# Patient Record
Sex: Female | Born: 1935
Health system: Southern US, Community
[De-identification: ages and names within clinical notes are randomized; demographics above are authoritative.]

## PROBLEM LIST (undated history)

## (undated) DIAGNOSIS — F329 Major depressive disorder, single episode, unspecified: Secondary | ICD-10-CM

## (undated) DIAGNOSIS — R413 Other amnesia: Secondary | ICD-10-CM

## (undated) DIAGNOSIS — F0390 Unspecified dementia without behavioral disturbance: Secondary | ICD-10-CM

## (undated) DIAGNOSIS — E785 Hyperlipidemia, unspecified: Secondary | ICD-10-CM

## (undated) DIAGNOSIS — K219 Gastro-esophageal reflux disease without esophagitis: Secondary | ICD-10-CM

## (undated) DIAGNOSIS — E079 Disorder of thyroid, unspecified: Secondary | ICD-10-CM

## (undated) DIAGNOSIS — E559 Vitamin D deficiency, unspecified: Secondary | ICD-10-CM

## (undated) DIAGNOSIS — F419 Anxiety disorder, unspecified: Secondary | ICD-10-CM

## (undated) DIAGNOSIS — N183 Chronic kidney disease, stage 3 unspecified: Secondary | ICD-10-CM

## (undated) HISTORY — DX: Chronic kidney disease, stage 3 unspecified: N18.30

## (undated) HISTORY — PX: WRIST SURGERY: SHX841

## (undated) HISTORY — PX: GLAUCOMA SURGERY: SHX656

## (undated) HISTORY — DX: Major depressive disorder, single episode, unspecified: F32.9

## (undated) HISTORY — DX: Other amnesia: R41.3

## (undated) HISTORY — DX: Anxiety disorder, unspecified: F41.9

## (undated) HISTORY — DX: Gastro-esophageal reflux disease without esophagitis: K21.9

## (undated) HISTORY — DX: Hyperlipidemia, unspecified: E78.5

## (undated) HISTORY — PX: BREAST BIOPSY: SHX20

## (undated) HISTORY — PX: CATARACT EXTRACTION, BILATERAL: SHX1313

## (undated) HISTORY — DX: Chronic kidney disease, stage 3 (moderate): N18.3

## (undated) HISTORY — DX: Vitamin D deficiency, unspecified: E55.9

---

## 2003-05-05 ENCOUNTER — Ambulatory Visit (HOSPITAL_COMMUNITY): Admission: RE | Admit: 2003-05-05 | Discharge: 2003-05-05 | Payer: Self-pay | Admitting: *Deleted

## 2003-05-12 ENCOUNTER — Ambulatory Visit (HOSPITAL_COMMUNITY): Admission: RE | Admit: 2003-05-12 | Discharge: 2003-05-12 | Payer: Self-pay | Admitting: Ophthalmology

## 2003-08-05 ENCOUNTER — Ambulatory Visit (HOSPITAL_COMMUNITY): Admission: RE | Admit: 2003-08-05 | Discharge: 2003-08-05 | Payer: Self-pay | Admitting: Gastroenterology

## 2005-01-01 ENCOUNTER — Other Ambulatory Visit: Admission: RE | Admit: 2005-01-01 | Discharge: 2005-01-01 | Payer: Self-pay | Admitting: Family Medicine

## 2006-04-15 ENCOUNTER — Encounter: Admission: RE | Admit: 2006-04-15 | Discharge: 2006-04-15 | Payer: Self-pay | Admitting: Endocrinology

## 2006-05-06 ENCOUNTER — Encounter: Admission: RE | Admit: 2006-05-06 | Discharge: 2006-05-06 | Payer: Self-pay | Admitting: Endocrinology

## 2007-08-21 ENCOUNTER — Encounter: Admission: RE | Admit: 2007-08-21 | Discharge: 2007-08-21 | Payer: Self-pay | Admitting: Family Medicine

## 2007-08-29 ENCOUNTER — Encounter: Admission: RE | Admit: 2007-08-29 | Discharge: 2007-08-29 | Payer: Self-pay | Admitting: Family Medicine

## 2007-09-11 ENCOUNTER — Encounter (INDEPENDENT_AMBULATORY_CARE_PROVIDER_SITE_OTHER): Payer: Self-pay | Admitting: Diagnostic Radiology

## 2007-09-11 ENCOUNTER — Encounter: Admission: RE | Admit: 2007-09-11 | Discharge: 2007-09-11 | Payer: Self-pay | Admitting: Family Medicine

## 2008-09-15 ENCOUNTER — Encounter: Admission: RE | Admit: 2008-09-15 | Discharge: 2008-09-15 | Payer: Self-pay | Admitting: Family Medicine

## 2008-12-15 ENCOUNTER — Emergency Department (HOSPITAL_COMMUNITY): Admission: EM | Admit: 2008-12-15 | Discharge: 2008-12-15 | Payer: Self-pay | Admitting: Emergency Medicine

## 2008-12-15 ENCOUNTER — Ambulatory Visit: Payer: Self-pay | Admitting: Diagnostic Radiology

## 2008-12-15 ENCOUNTER — Encounter: Payer: Self-pay | Admitting: Emergency Medicine

## 2009-10-12 ENCOUNTER — Encounter: Admission: RE | Admit: 2009-10-12 | Discharge: 2009-10-12 | Payer: Self-pay | Admitting: Family Medicine

## 2010-08-25 NOTE — Op Note (Signed)
NAME:  Jessica [name], Jessica [name]                       ACCOUNT NO.:  [id number]   MEDICAL RECORD NO.:  [id number]                   PATIENT TYPE:  AMB   LOCATION:  ENDO                                 FACILITY:  [facility]   PHYSICIAN:  Graylin Shiver, M.D.                DATE OF BIRTH:  11/15/1935   DATE OF PROCEDURE:  DATE OF DISCHARGE:                                 OPERATIVE REPORT   PROCEDURE:  Colonoscopy.   INDICATIONS:  Intermittent rectal bleeding, small amount.   Informed consent was obtained after explanation of the risks of bleeding,  infection, and perforation.  Premedication:  Fentanyl 75 mcg IV, Versed 8 mg  IV.   DESCRIPTION OF PROCEDURE:  With the patient in the left lateral decubitus  position a rectal exam was performed.  No masses were felt.  The Olympus  colonoscope was inserted into the rectum and advanced around the colon to  the cecum.  Cecal landmarks were identified.  The cecum and ascending colon  were normal, the transverse colon normal.  The descending colon and sigmoid  and rectum were normal.  There were some small areas of vascularity in the  anal area.  The scope was brought out.  She tolerated the procedure well  without complications.   IMPRESSION:  Normal colonoscopy to the cecum with some vascularity in the  anal area which I suspect is where the patient would notice some  intermittent blood at times with bowel movements on the toilet tissue.                                               Graylin Shiver, M.D.    Germain Osgood  D:  08/05/2003  T:  08/05/2003  Job:  [id number]

## 2013-02-04 ENCOUNTER — Encounter: Payer: Self-pay | Admitting: Podiatry

## 2013-02-04 ENCOUNTER — Ambulatory Visit (INDEPENDENT_AMBULATORY_CARE_PROVIDER_SITE_OTHER): Payer: Medicare Other | Admitting: Podiatry

## 2013-02-04 VITALS — BP 126/64 | HR 68 | Resp 92

## 2013-02-04 DIAGNOSIS — M7750 Other enthesopathy of unspecified foot: Secondary | ICD-10-CM

## 2013-02-04 DIAGNOSIS — L84 Corns and callosities: Secondary | ICD-10-CM

## 2013-02-04 NOTE — Progress Notes (Signed)
Subjective:     Patient ID: Jessica [name], female   DOB: 1936/02/02, 77 y.o.   MRN: [id number]  HPI patient states the corn is back on the little toe and I am excited to get my orthotic   Review of Systems  All other systems reviewed and are negative.       Objective:   Physical Exam  Nursing note and vitals reviewed. Constitutional: She is oriented to person, place, and time.  Cardiovascular: Intact distal pulses.   Musculoskeletal: Normal range of motion.  Neurological: She is oriented to person, place, and time.  Skin: Skin is warm.   Patient has a distal lateral keratotic lesion that's painful   and generalized pain bilateral with foot structural issue Assessment:     Abnormal foot structure with distal lateral keratotic lesion with digital deformity noted    Plan:     Orthotics dispensed with instructions and debridement of lesion accomplished. Explained I may need to straighten this toe if symptoms persist in future

## 2013-02-04 NOTE — Patient Instructions (Signed)

## 2013-03-18 ENCOUNTER — Ambulatory Visit (INDEPENDENT_AMBULATORY_CARE_PROVIDER_SITE_OTHER): Payer: Medicare Other | Admitting: Podiatry

## 2013-03-18 DIAGNOSIS — L84 Corns and callosities: Secondary | ICD-10-CM

## 2013-03-18 DIAGNOSIS — M2040 Other hammer toe(s) (acquired), unspecified foot: Secondary | ICD-10-CM

## 2013-03-18 DIAGNOSIS — M898X9 Other specified disorders of bone, unspecified site: Secondary | ICD-10-CM

## 2013-03-18 NOTE — Progress Notes (Signed)
Subjective:     Patient ID: Jessica [name], female   DOB: [birth year], 77 y.o.   MRN: [id number]  HPI patient states the orthotics do really well but this corn on my right fifth toe has been driving me crazy and only got better for several weeks   Review of Systems     Objective:   Physical Exam Neurovascular status intact with painful distal lateral keratotic lesion fifth toe right that is sore when pressed with a rotated toe noted    Assessment:     Rotated fifth toe right with distal lateral keratotic lesion formation    Plan:     Discussed distal arthroplasty along with exostectomy which I think will be in her best long-term interest. At this time went ahead and debrided lesion and educated her on surgery reappoint when symptoms occur again

## 2013-04-22 ENCOUNTER — Ambulatory Visit: Payer: Medicare Other | Admitting: Podiatry

## 2013-04-30 ENCOUNTER — Ambulatory Visit (INDEPENDENT_AMBULATORY_CARE_PROVIDER_SITE_OTHER): Payer: Commercial Managed Care - HMO | Admitting: Podiatry

## 2013-04-30 ENCOUNTER — Encounter: Payer: Self-pay | Admitting: Podiatry

## 2013-04-30 VITALS — BP 121/69 | HR 73 | Resp 16

## 2013-04-30 DIAGNOSIS — M898X9 Other specified disorders of bone, unspecified site: Secondary | ICD-10-CM

## 2013-04-30 DIAGNOSIS — M2040 Other hammer toe(s) (acquired), unspecified foot: Secondary | ICD-10-CM

## 2013-04-30 NOTE — Progress Notes (Signed)
Subjective:     Patient ID: Jessica [name], female   DOB: Aug 07, 1935, 78 y.o.   MRN: [id number]  HPI patient presents stating that cornice returning and I have been trying to work on it myself   Review of Systems     Objective:   Physical Exam Neurovascular status intact with a keratotic lesion distal lateral fifth toe right that it's painful when pressed    Assessment:     Continued exostotic lesion fifth toe right foot    Plan:     Debrided and reviewed condition. I do think ultimately surgery could be done to remove this bone spur and remove the lesion primarily. Educated her on this and she will think about this as it to its

## 2014-09-13 DIAGNOSIS — N183 Chronic kidney disease, stage 3 (moderate): Secondary | ICD-10-CM | POA: Diagnosis not present

## 2014-09-13 DIAGNOSIS — F419 Anxiety disorder, unspecified: Secondary | ICD-10-CM | POA: Diagnosis not present

## 2014-09-13 DIAGNOSIS — E039 Hypothyroidism, unspecified: Secondary | ICD-10-CM | POA: Diagnosis not present

## 2014-09-13 DIAGNOSIS — M545 Low back pain: Secondary | ICD-10-CM | POA: Diagnosis not present

## 2014-09-13 DIAGNOSIS — F329 Major depressive disorder, single episode, unspecified: Secondary | ICD-10-CM | POA: Diagnosis not present

## 2014-09-13 DIAGNOSIS — M791 Myalgia: Secondary | ICD-10-CM | POA: Diagnosis not present

## 2014-09-13 DIAGNOSIS — E785 Hyperlipidemia, unspecified: Secondary | ICD-10-CM | POA: Diagnosis not present

## 2014-10-19 DIAGNOSIS — K648 Other hemorrhoids: Secondary | ICD-10-CM | POA: Diagnosis not present

## 2014-10-19 DIAGNOSIS — F419 Anxiety disorder, unspecified: Secondary | ICD-10-CM | POA: Diagnosis not present

## 2014-10-19 DIAGNOSIS — R413 Other amnesia: Secondary | ICD-10-CM | POA: Diagnosis not present

## 2014-10-19 DIAGNOSIS — M2010 Hallux valgus (acquired), unspecified foot: Secondary | ICD-10-CM | POA: Diagnosis not present

## 2014-12-21 DIAGNOSIS — H43811 Vitreous degeneration, right eye: Secondary | ICD-10-CM | POA: Diagnosis not present

## 2014-12-21 DIAGNOSIS — Z961 Presence of intraocular lens: Secondary | ICD-10-CM | POA: Diagnosis not present

## 2014-12-21 DIAGNOSIS — H4322 Crystalline deposits in vitreous body, left eye: Secondary | ICD-10-CM | POA: Diagnosis not present

## 2015-02-21 DIAGNOSIS — N183 Chronic kidney disease, stage 3 (moderate): Secondary | ICD-10-CM | POA: Diagnosis not present

## 2015-02-21 DIAGNOSIS — F419 Anxiety disorder, unspecified: Secondary | ICD-10-CM | POA: Diagnosis not present

## 2015-02-21 DIAGNOSIS — E559 Vitamin D deficiency, unspecified: Secondary | ICD-10-CM | POA: Diagnosis not present

## 2015-02-21 DIAGNOSIS — Z23 Encounter for immunization: Secondary | ICD-10-CM | POA: Diagnosis not present

## 2015-02-21 DIAGNOSIS — E039 Hypothyroidism, unspecified: Secondary | ICD-10-CM | POA: Diagnosis not present

## 2015-02-21 DIAGNOSIS — F329 Major depressive disorder, single episode, unspecified: Secondary | ICD-10-CM | POA: Diagnosis not present

## 2015-04-25 DIAGNOSIS — R413 Other amnesia: Secondary | ICD-10-CM | POA: Diagnosis not present

## 2015-04-25 DIAGNOSIS — E039 Hypothyroidism, unspecified: Secondary | ICD-10-CM | POA: Diagnosis not present

## 2015-04-25 DIAGNOSIS — F419 Anxiety disorder, unspecified: Secondary | ICD-10-CM | POA: Diagnosis not present

## 2015-07-05 ENCOUNTER — Emergency Department (HOSPITAL_BASED_OUTPATIENT_CLINIC_OR_DEPARTMENT_OTHER): Payer: Commercial Managed Care - HMO

## 2015-07-05 ENCOUNTER — Encounter (HOSPITAL_BASED_OUTPATIENT_CLINIC_OR_DEPARTMENT_OTHER): Payer: Self-pay | Admitting: *Deleted

## 2015-07-05 ENCOUNTER — Emergency Department (HOSPITAL_BASED_OUTPATIENT_CLINIC_OR_DEPARTMENT_OTHER)
Admission: EM | Admit: 2015-07-05 | Discharge: 2015-07-05 | Disposition: A | Discharge status: Home / Self Care | Payer: Commercial Managed Care - HMO | Attending: Emergency Medicine | Admitting: Emergency Medicine

## 2015-07-05 DIAGNOSIS — R0789 Other chest pain: Secondary | ICD-10-CM | POA: Diagnosis not present

## 2015-07-05 DIAGNOSIS — E079 Disorder of thyroid, unspecified: Secondary | ICD-10-CM | POA: Diagnosis not present

## 2015-07-05 DIAGNOSIS — Z79899 Other long term (current) drug therapy: Secondary | ICD-10-CM | POA: Insufficient documentation

## 2015-07-05 DIAGNOSIS — R079 Chest pain, unspecified: Secondary | ICD-10-CM | POA: Diagnosis not present

## 2015-07-05 HISTORY — DX: Disorder of thyroid, unspecified: E07.9

## 2015-07-05 LAB — COMPREHENSIVE METABOLIC PANEL
ALBUMIN: 3.5 g/dL (ref 3.5–5.0)
ALT: 11 U/L — ABNORMAL LOW (ref 14–54)
AST: 18 U/L (ref 15–41)
Alkaline Phosphatase: 82 U/L (ref 38–126)
Anion gap: 8 (ref 5–15)
BUN: 12 mg/dL (ref 6–20)
CHLORIDE: 100 mmol/L — AB (ref 101–111)
CO2: 24 mmol/L (ref 22–32)
Calcium: 8.7 mg/dL — ABNORMAL LOW (ref 8.9–10.3)
Creatinine, Ser: 0.83 mg/dL (ref 0.44–1.00)
GFR calc Af Amer: >60 mL/min (ref >60)
Glucose, Bld: 97 mg/dL (ref 65–99)
POTASSIUM: 3.8 mmol/L (ref 3.5–5.1)
SODIUM: 132 mmol/L — AB (ref 135–145)
Total Bilirubin: 0.5 mg/dL (ref 0.3–1.2)
Total Protein: 7.1 g/dL (ref 6.5–8.1)

## 2015-07-05 LAB — CBC WITH DIFFERENTIAL/PLATELET
Basophils Absolute: 0 10*3/uL (ref 0.0–0.1)
Basophils Relative: 0 %
EOS PCT: 1 %
Eosinophils Absolute: 0 10*3/uL (ref 0.0–0.7)
HCT: 35.1 % — ABNORMAL LOW (ref 36.0–46.0)
HEMOGLOBIN: 12.2 g/dL (ref 12.0–15.0)
LYMPHS ABS: 1.6 10*3/uL (ref 0.7–4.0)
LYMPHS PCT: 25 %
MCH: 30.8 pg (ref 26.0–34.0)
MCHC: 34.8 g/dL (ref 30.0–36.0)
MCV: 88.6 fL (ref 78.0–100.0)
MONOS PCT: 8 %
Monocytes Absolute: 0.5 10*3/uL (ref 0.1–1.0)
Neutro Abs: 4.2 10*3/uL (ref 1.7–7.7)
Neutrophils Relative %: 66 %
PLATELETS: 272 10*3/uL (ref 150–400)
RBC: 3.96 MIL/uL (ref 3.87–5.11)
RDW: 12.1 % (ref 11.5–15.5)
WBC: 6.3 10*3/uL (ref 4.0–10.5)

## 2015-07-05 LAB — TROPONIN I: Troponin I: <0.03 ng/mL (ref <0.031)

## 2015-07-05 NOTE — ED Notes (Signed)
Patient taken to and from radiology department via stretcher.

## 2015-07-05 NOTE — ED Notes (Signed)
MD at bedside. 

## 2015-07-05 NOTE — ED Notes (Signed)
Chest pain this am left sided pressure that goes under her arm.

## 2015-07-05 NOTE — Discharge Instructions (Signed)
Ibuprofen 600 mg every 6 hours as needed for pain.  Return to the ER if symptoms significantly worsen or change.   Chest Wall Pain Chest wall pain is pain in or around the bones and muscles of your chest. Sometimes, an injury causes this pain. Sometimes, the cause may not be known. This pain may take several weeks or longer to get better. HOME CARE INSTRUCTIONS  Pay attention to any changes in your symptoms. Take these actions to help with your pain:   Rest as told by your health care provider.   Avoid activities that cause pain. These include any activities that use your chest muscles or your abdominal and side muscles to lift heavy items.   If directed, apply ice to the painful area:  Put ice in a plastic bag.  Place a towel between your skin and the bag.  Leave the ice on for 20 minutes, 2-3 times per day.  Take over-the-counter and prescription medicines only as told by your health care provider.  Do not use tobacco products, including cigarettes, chewing tobacco, and e-cigarettes. If you need help quitting, ask your health care provider.  Keep all follow-up visits as told by your health care provider. This is important. SEEK MEDICAL CARE IF:  You have a fever.  Your chest pain becomes worse.  You have new symptoms. SEEK IMMEDIATE MEDICAL CARE IF:  You have nausea or vomiting.  You feel sweaty or light-headed.  You have a cough with phlegm (sputum) or you cough up blood.  You develop shortness of breath.   This information is not intended to replace advice given to you by your health care provider. Make sure you discuss any questions you have with your health care provider.   Document Released: 03/26/2005 Document Revised: 12/15/2014 Document Reviewed: 06/21/2014 Elsevier Interactive Patient Education [employer].

## 2015-07-05 NOTE — ED Provider Notes (Signed)
CSN: [id number]     Arrival date & time 07/05/15  1337 History   First MD Initiated Contact with Patient 07/05/15 1427     Chief Complaint  Patient presents with  . Chest Pain     (Consider location/radiation/quality/duration/timing/severity/associated sxs/prior Treatment) HPI Comments: Patient is a 80 year old female with history of thyroid disease. She presents for evaluation of chest discomfort. This started earlier this morning. She has had multiple episodes of this that come and go. The typical episode lasts for approximately 1 minute, then resolves. It begins lateral to her left breast and radiates to the center of her chest. This is described as sharp in nature and not associated with any shortness of breath, nausea, diaphoresis, or radiation to the arm or jaw. She denies any leg or calf swelling. She denies any fevers, chills, or productive cough.  Patient is a 80 y.o. female presenting with chest pain. The history is provided by the patient.  Chest Pain Pain location:  L chest Pain quality: sharp   Radiates to: Center of chest. Pain radiates to the back: no   Pain severity:  Moderate Onset quality:  Sudden Timing:  Constant Progression:  Unchanged Chronicity:  New Relieved by:  Nothing Worsened by:  Nothing tried Ineffective treatments:  None tried   Past Medical History  Diagnosis Date  . Thyroid disease    History reviewed. No pertinent past surgical history. No family history on file. Social History  Substance Use Topics  . Smoking status: Never Smoker   . Smokeless tobacco: None  . Alcohol Use: No   OB History    No data available     Review of Systems  Cardiovascular: Positive for chest pain.  All other systems reviewed and are negative.     Allergies  Review of patient's allergies indicates no known allergies.  Home Medications   Prior to Admission medications   Medication Sig Start Date End Date Taking? Authorizing Provider  alendronate  (FOSAMAX) 70 MG tablet Take 70 mg by mouth every 7 (seven) days. Take with a full glass of water on an empty stomach.    Historical Provider, MD  esomeprazole (NEXIUM) 40 MG capsule Take 40 mg by mouth daily before breakfast.    Historical Provider, MD  levothyroxine (SYNTHROID, LEVOTHROID) 75 MCG tablet Take 75 mcg by mouth daily before breakfast.    Historical Provider, MD  pravastatin (PRAVACHOL) 40 MG tablet Take 40 mg by mouth daily.    Historical Provider, MD  sertraline (ZOLOFT) 50 MG tablet Take 50 mg by mouth daily.    Historical Provider, MD   BP 139/65 mmHg  Pulse 66  Temp(Src) 98.5 F (36.9 C) (Oral)  Resp 18  Ht 4\' 11"  (1.499 m)  Wt 130 lb (58.968 kg)  BMI 26.24 kg/m2  SpO2 100% Physical Exam  Constitutional: She is oriented to person, place, and time. She appears well-developed and well-nourished. No distress.  HENT:  Head: Normocephalic and atraumatic.  Neck: Normal range of motion. Neck supple.  Cardiovascular: Normal rate and regular rhythm.  Exam reveals no gallop and no friction rub.   No murmur heard. Pulmonary/Chest: Effort normal and breath sounds normal. No respiratory distress. She has no wheezes.  Abdominal: Soft. Bowel sounds are normal. She exhibits no distension. There is no tenderness.  Musculoskeletal: Normal range of motion.  Neurological: She is alert and oriented to person, place, and time.  Skin: Skin is warm and dry. She is not diaphoretic.  Nursing note and vitals  reviewed.   ED Course  Procedures (including critical care time) Labs Review Labs Reviewed  COMPREHENSIVE METABOLIC PANEL  CBC WITH DIFFERENTIAL/PLATELET  TROPONIN I    Imaging Review No results found. I have personally reviewed and evaluated these images and lab results as part of my medical decision-making.   EKG Interpretation   Date/Time:  Tuesday July 05 2015 13:45:13 EDT Ventricular Rate:  66 PR Interval:  142 QRS Duration: 74 QT Interval:  394 QTC Calculation:  413 R Axis:   30 Text Interpretation:  Normal sinus rhythm Cannot rule out Anterior infarct  , age undetermined Abnormal ECG Confirmed by Kypton Eltringham  MD, Carling Liberman ([id number]) on  07/05/2015 3:02:55 PM      MDM   Final diagnoses:  None    Patient presents with complaints of intermittent sharp pains in the left side of her chest. These symptoms are very atypical for cardiac pain and her EKG and troponin are both normal. I highly doubt a cardiac etiology and favor a musculoskeletal cause. At this point I see no indication for admission. I feel as though she is appropriate for discharge.    Veryl Speak, MD 07/05/15 [phone]

## 2015-07-05 NOTE — ED Notes (Signed)
Pt states she had a couple of episodes of sharp left side CP which radiated to her mid chest this a.m. Pain stopped by the time it reached the middle of her chest. Denies other s/s.

## 2015-07-14 ENCOUNTER — Ambulatory Visit: Payer: Commercial Managed Care - HMO | Attending: Psychology | Admitting: Psychology

## 2015-07-14 DIAGNOSIS — F32A Depression, unspecified: Secondary | ICD-10-CM

## 2015-07-14 DIAGNOSIS — F068 Other specified mental disorders due to known physiological condition: Secondary | ICD-10-CM

## 2015-07-14 DIAGNOSIS — F329 Major depressive disorder, single episode, unspecified: Secondary | ICD-10-CM | POA: Insufficient documentation

## 2015-07-14 DIAGNOSIS — R413 Other amnesia: Secondary | ICD-10-CM | POA: Insufficient documentation

## 2015-07-15 NOTE — Progress Notes (Addendum)
[facility]  [address]   Telephone [phone] [address] Fax [phone] [place], [place] [address]   [place]* This report should not be released without the consent of the client  Name:   Jessica [name]   Date of Birth:  01-19-2036 [facility] MR#:  [id number] Date of Evaluation: 07/14/15  Reason for Referral & Background Jessica [name] is a 80 year-old right-handed woman who was referred for neuropsychological evaluation by Harlan Stains MD of [place] [facility]. This referral was prompted by an apparent decline in her memory over the past year. In addition, it was noted that she has complained of fatigue, anxiety and depression. After an apparent sub-optimal response to sertraline, she was switched to fluoxetine in November 2016 though discontinued use in December 2016 due to feeling more anxious and sleepy. Her past medical history was notable for chronic kidney disease (stage 3), gastroesophageal reflux, hypercholesterolemia, hypothyroidism, osteoarthritis and osteoporosis. She takes levothyroxine.  Sources of Information Medical records from Dr. Dema Severin and electronic medical records from the [place] were reviewed. Jessica [name] and her sister, Jessica [name], were interviewed.   Interview Ms. Kuramoto reported being aware of a decline in her focus and memory over the past year though could not cite any specific examples nor say how her memory loss has affected her life. She did not report any problems performing instrumental activities of household management, use of home appliances, cooking, medication self-administration, driving (except for two or three occasions of getting lost while driving within the past year) or financial management. She did not cite any problems related to pain, mobility, sensory-perceptual skills or vegetative functions. She did not volunteer any problems with mood.  When asked if she felt depressed, she replied "I'm not sure". When pressed she acknowledged periodically feeling a bit discouraged but could not state what has tended to trigger these moods nor how often she has felt this way (not on a daily basis, however). When asked if she had a problem with anxiety, she said "I probably had some but I don't particularly remember it". She did not report any changes in her interests, which were stated as reading and attending church. She did not report any ongoing stressors or recent negative life events. She denied experiencing hallucinations or delusions.  Her sister reported that Jessica [name] has been displaying cognitive decline for at least the past year but was not certain when it began. She gave examples of her sister losing or misplacing objects, having trouble finding words while talking and leaving out ingredients while cooking. She described her as having hearing difficulties despite wearing hearing aids. She was not certain if her sister has been depressed as she has not appeared sad or tearful, nor has she expressed themes of depression. She has not appeared anxious at all. She has not observed her sister to have exhibited confusion, wandering, bizarre behavior, loss of social comportment, signs of mood instability or unsafe behavior.   Jessica [name] lives alone. Her sister lives nearby and has been checking in on her often. Jessica [name] has lived alone since the death of a sister in December 28, 2012. That sister had an intellectual disability and was cared for by Jessica [name]. She has two surviving siblings from a total of six. Jessica [name] has never married and has no children.   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  She reported that she was graduated from high school as a good Ship broker without problems with attention or learning. Her sister reported that she  attended [employer] in the 1s but Jessica [name] did not recall this.  Jessica [name] reported no knowledge of any history of unusual childhood illnesses, developmental delays, head injury, loss of consciousness, seizure activity, stroke-like symptoms or exposure to toxic chemicals.   She reported rare alcohol use without history of abuse. She reported that she has never used illicit drugs. She stopped using snuff in 2014.     She reported no history of mental health contacts.   She was not aware of a family history of memory disorder or dementia.   Behavioral Observations She appeared as an appropriately dressed and groomed woman in no apparent physical distress. She interacted in a pleasant albeit somewhat inert manner. Her affect appeared within a wide range. She did not display signs of emotional distress. She tended to be slow to respond to questions. She often answered with  "I don't know" when asked about current information (e.g., the names of her medications, her medical conditions) or past life events (e.g., her last job, the year she retired). She seemed able to comprehend conversational speech without difficulty. Her thought processes were organized without loose associations, verbal perseverations or flight of ideas. Her thought content was devoid of unusual or bizarre ideas.   Evaluation Procedures Animal Naming Test  [employer] Test Clock Drawing Test Controlled Oral Word Association Test Geriatric Depression Scale  Rey 15-Item Memory Test Trail Making A & B Wechsler Adult Intelligence Scale-IV: Music therapist, Coding, Digit Span & Similarities  Wechsler Memory Scale-IV: Older Adult Battery Wide Range Achievement Test-4: Word Reading  Assessment Results She appeared alert and focused. She did not display signs of physical or emotional distress. She did not report or display problems with vision (she wore her eyeglasses) , hearing (she wore her hearing aids) or motor control. She had  no apparent problems understanding task instructions.   Test results were deemed to be of questionable validity given doubts about her level of effort. She was observed to often sigh, give up quickly and need prompting to give an answer. Moreover, she failed a performance validity test (Rey 15-Item Memory Test) that required her to immediately draw fifteen symbols from memory that can be easily accomplished due to the redundancy amongst the items. She was able to draw only five symbols.   Given indications of questionable validity her low scores on neuropsychological tests could not be interpreted as evidence of brain dysfunction or considered representative of her actual level of cognitive functioning. On the other hand, since it is not possible to feign cognitive competency, scores within the normal range likely represented intact areas of ability.   Her test scores were corrected to reflect norms for her age and, whenever possible, her gender and educational level (i.e., 13 years).   She performed within the Average range on measures of immediate auditory attention span (Wechsler Adult Intelligence Scale-IV (WAIS-IV) Digit Span Forward), visual processing speed on a clerical-type task (WAIS-IV Coding), word reading (Wide Range Achievement Test-4) and visual-spatial organization Product manager).  She performed within the Low Average range on measures of phonemic word fluency (Controlled Oral Word Association Test) and abstract verbal reasoning (WAIS-IV Similarities).  She performed within the Borderline range (i.e., 2nd to 8th percentile) on measures of naming to confrontation [employer]), semantic fluency Geologist, engineering) and Ecologist (  Clock Drawing Test).   She performed within the Impaired range on tests of simple visual scanning/sequencing speed (Trails A), complex visual sequencing involving set shifting (Trails B), visual working Marine scientist (Engineer, agricultural Scale-IV  (WMS-IV) Symbol Span), immediate memory (WMS-IV) and delayed memory (WMS-IV). Of note, while her immediate memory was impaired and her ability after a delay to freely recall what she had learned even more so, her delayed recognition memory was within normal limits.  Screening for depression using the self-report Geriatric Depression Scale was not suggestive of depression based on her score of 7/30. The symptoms she did endorse shared the themes of cognitive difficulties or low energy/initiative.    Summary & Conclusions Jessica [name] is a 80 year-old  woman who has displayed an at least one year history of memory loss in context of complaints of fatigue and possible symptoms of depression.  This calm and pleasant woman did not display any signs of physical or emotional distress. She often responded with "I don't know" to questions about recent and remote life events.   Neuropsychological test results were deemed to be of questionable validity given indications of sub-optimal effort. Therefore her low scores on neuropsychological tests could not be interpreted with confidence as representative of her actual level of cognitive functioning. That being said, her poorest performances, which were well within the impaired range, were on measures of visual attention/sequencing and memory. With regards to memory, while her performance on a measure of immediate memory was poor and on delayed recall even more deficient, her delayed recognition memory was within normal limits.  With regards to her emotional status, she did not report much affective distress nor label herself as depressed though she did endorse low energy level and lacking initiative.   In conclusion, it is difficult to draw firm conclusions about her cognitive functioning given her questionable effort on cognitive testing. Clinical observations coupled with her neuropsychological profile suggested that her cognitive functioning is being  disrupted by diminished motivation. She might have a depressive disorder though this was not supported by her self-report. Finally, it is possible that her diminished motivation is a co-morbid feature of an underlying neurodegenerative condition.   Diagnostic Impressions Memory loss [R41.3] Unspecified depressive disorder [F32.9]  Recommendation Consider referral to neurology for further assessment.     I have appreciated the opportunity to evaluate Jessica [name]. The conclusions from this evaluation were discussed with her by telephone on  07/19/15. Please feel free to contact me with any comments or questions.    ______________________ Jamey Ripa, Ph.D Licensed Psychologist       ADDENDUM-NEUROPSYCHOLOGICAL TEST RESULTS  Animal Naming Test Score= 12 8th  (adjusted for age, gender and educational level)   [place] Naming Test Score= 29/60 4th  (adjusted for age, gender and educational level)   Clock Drawing Test Score=8/10 Borderline: Numbers present but errors in spatial arrangement. Hands in correct position but no difference in size.    Controlled Oral Word Association Test Score= 26 words/1 repetition 14th (adjusted for age, gender and educational level)   Rey 15-Item Memory Test  Score= 5/15 Well below cut-off    Trails A Score= 81s 0e <1st (adjusted for age, gender and educational level)  Trails B Score= discontinued after 300s and 2e Impaired    Wechsler Adult Intelligence Scale-IV   Subtest Scaled Score Percentile  Block Design 10   50th     Similarities   6     9th     Digit Span  Forward   Backward   Sequencing   9  12   9   5    37th      75th     37th       5th     Coding     9   37th        Wechsler Memory Scale-IV Older Adult Battery Index Index Score Percentile  Immediate Memory 60 <1st        Auditory Memory 45 <1st     Visual Memory 73   4th       Delayed Memory 46 <1st       Symbol Span subtest Scaled score= 3   1st         Wide Range Achievement Test-4 Subtest  Raw score Standard score Percentile  Word Reading 54/70 96 39th

## 2015-08-08 ENCOUNTER — Ambulatory Visit (INDEPENDENT_AMBULATORY_CARE_PROVIDER_SITE_OTHER): Payer: Commercial Managed Care - HMO | Admitting: Neurology

## 2015-08-08 ENCOUNTER — Encounter: Payer: Self-pay | Admitting: Neurology

## 2015-08-08 VITALS — BP 122/72 | HR 70 | Resp 16 | Ht 64.0 in | Wt 128.0 lb

## 2015-08-08 DIAGNOSIS — F0390 Unspecified dementia without behavioral disturbance: Secondary | ICD-10-CM | POA: Diagnosis not present

## 2015-08-08 DIAGNOSIS — F329 Major depressive disorder, single episode, unspecified: Secondary | ICD-10-CM | POA: Diagnosis not present

## 2015-08-08 DIAGNOSIS — F32A Depression, unspecified: Secondary | ICD-10-CM

## 2015-08-08 MED ORDER — DONEPEZIL HCL 5 MG PO TABS: 5.0000 mg | ORAL_TABLET | Freq: Every day | ORAL | Status: DC

## 2015-08-08 NOTE — Patient Instructions (Addendum)
You have complaints of memory loss: memory loss or changes in cognitive function can have many reasons and does not always mean you have dementia. Conditions that can contribute to subjective or objective memory loss include: depression, stress, poor sleep from insomnia or sleep apnea, dehydration, fluctuation in blood sugar values, thyroid or electrolyte dysfunction and certain vitamin deficiencies. Dementia can be caused by stroke, brain atherosclerosis or brain vascular disease due to vascular risk factors (smoking, high blood pressure, high cholesterol, obesity and uncontrolled diabetes), certain degenerative brain disorders (including Parkinson's disease and Multiple sclerosis) and by Alzheimer's disease or other, more rare and sometimes hereditary causes. We will do some additional testing: blood work (which has been done recently already) and we will do a brain scan. We will call you with brain scan test results and monitor your symptoms. Your memory loss is mild at this point, which, of course is reassuring.  As discussed, I would recommend a memory medication, called Aricept (generic name: donepezil) 5 mg: take one pill each evening. Common side effects may include dry eyes, dry mouth, confusion, low pulse, low blood pressure, also GI related side effects (nausea, vomiting, diarrhea, constipation), headaches; rare side effects may include hallucinations and seizures.   You have recently stopped your antidepressant medication. It may be worthwhile to restart a different type of antidepressant perhaps in the near future. Please discuss with Dr. Dema Severin.

## 2015-08-08 NOTE — Progress Notes (Signed)
Subjective:    Patient ID: Jessica [name] is a 80 y.o. female.  HPI     HPI:  Dear Dr. Dema Severin,   I saw your patient, Jessica [name], upon your kind request in my neurologic clinic today for initial consultation of her memory loss. The patient is accompanied by her older sister, Glennie Isle, today. As you know, [name] is a 80 year old right-handed woman with an underlying medical history of thyroid disease, vitamin D deficiency, reflux disease, depression, chronic kidney disease, hyperlipidemia, anxiety and overweight state, who reports memory loss for the past few months or up to a year. She has primarily short-term memory issues, with forgetfulness, misplacing things etc. She has no history of delusion, hallucinations, or behavioral disturbances.  She lives by herself. Sister lives nearby and checks on her nearly daily, talks to her every day and stays overnight on Wednesdays and Sundays as they go to the same church and [place] in [place] distance from where the patient lives.  There is no obvious family history of dementia. The patient is single. She has no children. Their oldest sister and their youngest sister passed away and 2 brothers passed away. Neither one of them had dementia as I understand.   I reviewed your office note from 04/25/2015, which you kindly included. Recent blood work from 02/21/2015 was reviewed as well. CBC with differential was normal, CMP showed no significant abnormality, TSH 2.13, vitamin D level within range.  She has had flareup in her depression and anxiety.  She had a neuropsychological evaluation with Dr. Valentina Shaggy on 07/14/2015, and I reviewed the report:   Memory loss [R41.3] Unspecified depressive disorder [F32.9]  Recommendation Consider referral to neurology for further assessment.     Of note, she went to the emergency room on 07/05/2015 for chest pain. I reviewed the records. EKG and cardiac enzymes were unremarkable. She was  describing a sharp chest pain.  Her Past Medical History Is Significant For: Past Medical History  Diagnosis Date  . Thyroid disease   . Vitamin D deficiency   . GERD without esophagitis   . Major depressive disorder, single episode, unspecified ([place])   . CKD (chronic kidney disease), stage III   . Hyperlipemia   . Memory difficulty   . Anxiety     Her Past Surgical History Is Significant For: Past Surgical History  Procedure Laterality Date  . Breast biopsy Left   . Cataract extraction, bilateral    . Glaucoma surgery    . Wrist surgery Left     Her Family History Is Significant For: Family History  Problem Relation Age of Onset  . CAD Father   . CAD Brother   . CVA Paternal Grandmother     Her Social History Is Significant For: Social History   Social History  . Marital Status: Single    Spouse Name: N/A  . Number of Children: 0  . Years of Education: college    Occupational History  . Retired    Social History Main Topics  . Smoking status: Never Smoker   . Smokeless tobacco: None  . Alcohol Use: No  . Drug Use: No  . Sexual Activity: Not Asked   Other Topics Concern  . None   Social History Narrative   Drinks 1 cup of coffee a day     Her Allergies Are:  No Known Allergies:   Her Current Medications Are:  Outpatient Encounter Prescriptions as of 08/08/2015  Medication Sig  .  Cholecalciferol (VITAMIN D3) 2000 units capsule Take by mouth.  . levothyroxine (SYNTHROID, LEVOTHROID) 75 MCG tablet Take 75 mcg by mouth daily before breakfast.  . [DISCONTINUED] alendronate (FOSAMAX) 70 MG tablet Take 70 mg by mouth every 7 (seven) days. Take with a full glass of water on an empty stomach.  . [DISCONTINUED] esomeprazole (NEXIUM) 40 MG capsule Take 40 mg by mouth daily before breakfast.  . [DISCONTINUED] pravastatin (PRAVACHOL) 40 MG tablet Take 40 mg by mouth daily.  . [DISCONTINUED] sertraline (ZOLOFT) 50 MG tablet Take 50 mg by mouth daily.   No  facility-administered encounter medications on file as of 08/08/2015.  :  Review of Systems:  Out of a complete 14 point review of systems, all are reviewed and negative with the exception of these symptoms as listed below:   Review of Systems  Neurological:       Short term Memory loss. Patient reports that she "doesn't notice it".     Objective:  Neurologic Exam  Physical Exam Physical Examination:   Filed Vitals:   08/08/15 0955  BP: 122/72  Pulse: 70  Resp: 16    General Examination: The patient is a very pleasant 80 y.o. female in no acute distress. She appears well-developed and well-nourished and well groomed.   HEENT: Normocephalic, atraumatic, pupils are equal, round and reactive to light and accommodation. Funduscopic exam is normal with sharp disc margins noted. Extraocular tracking is good without limitation to gaze excursion or nystagmus noted. Normal smooth pursuit is noted. Hearing appears mildly impaired. Face is symmetric with normal facial animation and normal facial sensation. Speech is clear with no dysarthria noted. There is no hypophonia. There is no lip, neck/head, jaw or voice tremor. Neck is supple with full range of passive and active motion. There are no carotid bruits on auscultation. Oropharynx exam reveals: mild mouth dryness, adequate dental hygiene and mild airway crowding. Mallampati is class II. Tongue protrudes centrally and palate elevates symmetrically.    Chest: Clear to auscultation without wheezing, rhonchi or crackles noted.  Heart: S1+S2+0, regular and normal without murmurs, rubs or gallops noted.   Abdomen: Soft, non-tender and non-distended with normal bowel sounds appreciated on auscultation.  Extremities: There is trace pitting edema in the distal lower extremities bilaterally. Pedal pulses are intact.  Skin: Warm and dry without trophic changes noted.   Musculoskeletal: exam reveals no obvious joint deformities, tenderness or joint  swelling or erythema.   Neurologically:  Mental status: The patient is awake, alert and oriented in all 4 spheres. Her immediate and remote memory, attention, language skills and fund of knowledge are mildly impaired. There is no evidence of aphasia, agnosia, apraxia or anomia. Speech is clear with normal prosody and enunciation. Thought process is linear. Mood is decreased range and affect is blunted.   On 08/08/2015: MMSE: 24/30, clock drawing 3 out of 4, animal fluency 6/m.  Cranial nerves II - XII are as described above under HEENT exam. In addition: shoulder shrug is normal with equal shoulder height noted. Motor exam: Normal bulk, strength and tone is noted. There is no drift, tremor or rebound. Romberg is negative. Reflexes are 1+ throughout. Fine motor skills and coordination: intact with normal finger taps, normal hand movements, normal rapid alternating patting, normal foot taps and normal foot agility.  Cerebellar testing: No dysmetria or intention tremor on finger to nose testing. Heel to shin is unremarkable bilaterally. There is no truncal or gait ataxia.  Sensory exam: intact to light touch, pinprick,  vibration, temperature sense in the upper and lower extremities.  Gait, station and balance: She stands with no significant difficulty but stands up slowly. She has no problems walking and walks without a walking aid. She has some trouble with tandem walk. Balance is slightly impaired.   Assessment and Plan:    In summary, LOUVINA SOBIESKI is a very pleasant 80 y.o.-year old female with an underlying medical history of thyroid disease, vitamin D deficiency, reflux disease, depression, chronic kidney disease, hyperlipidemia, anxiety and overweight state, who reports memory loss for the past few months or up to a year. She has recently undergone  neuropsychological testing with no clear-cut results but evidence of memory loss and an underlying depressive disorder. She has not been on  medication for depression recently. She stopped generic Prozac and also more recently has stopped her sertraline. Apparently, she did have side effects on the Prozac but is not sure as to why she stopped the sertraline. She is also no longer on her cholesterol medication. Her memory scores indicate mild memory loss, dementia without obvious behavioral disturbance. She has no family history of dementia. We will proceed with a brain MRI. She is reluctant somewhat to proceed with this and is worried about the cost. We talked about memory loss and dementia quite a bit today. She lives alone. I have advised her to start thinking about long-term living situations. She is encouraged to think about her life alert button or some other form of call alert button. She is advised to monitor her driving skills. She is encouraged to drive within a smaller radius only, no nighttime driving, no highway driving, no long-distance driving.   I had a long chat with the patient and [name] about my findings and the diagnosis of memory loss and dementia, its prognosis and treatment options. Implications of diagnosis explained at length with the patient. We talked about medical treatments and non-pharmacological approaches. She is advised to stay active mentally and physically. She likes to sit and read a lot. She does not like to watch TV. She is advised to stay better hydrated with water as she only drinks 2 glasses of water per day and mostly tea, some coffee in the morning.  I encouraged the patient to eat healthy, exercise daily and keep well hydrated, to keep a scheduled bedtime and wake time routine. I stressed the importance of regular exercise, within of course the patient's own mobility limitations.  I would like to get her started on Aricept low-dose, generic. She will start with 5 mg once daily. I provided a new prescription and written instructions. We talked about potential side effects. We will call her with her brain MRI  results once available. I would like to see her back in about 3 months, sooner if needed. I answered all their questions today and the patient and her sister were in agreement.   Thank you very much for allowing me to participate in the care of this nice patient. If I can be of any further assistance to you please do not hesitate to call me at [phone].  Sincerely,   Star Age, MD, PhD

## 2015-08-15 ENCOUNTER — Ambulatory Visit
Admission: RE | Admit: 2015-08-15 | Discharge: 2015-08-15 | Disposition: A | Discharge status: Home / Self Care | Payer: Commercial Managed Care - HMO | Source: Ambulatory Visit | Attending: Neurology | Admitting: Neurology

## 2015-08-15 DIAGNOSIS — F0390 Unspecified dementia without behavioral disturbance: Secondary | ICD-10-CM

## 2015-08-15 DIAGNOSIS — F32A Depression, unspecified: Secondary | ICD-10-CM

## 2015-08-15 DIAGNOSIS — F329 Major depressive disorder, single episode, unspecified: Secondary | ICD-10-CM

## 2015-08-15 NOTE — Progress Notes (Signed)
Quick Note:  Please call patient or sister regarding the recent brain MRI: The brain scan showed a normal structure of the brain and moderate volume loss which we call atrophy. There were changes in the deeper structures of the brain, which we call white matter changes or microvascular changes. These were reported as mild in Her case. These are tiny white spots, that occur with time and are seen in a variety of conditions, including with normal aging, chronic hypertension, chronic headaches, especially migraine HAs, chronic diabetes, chronic hyperlipidemia. These are not strokes and no mass or lesion were seen which is reassuring. Again, there were no acute findings, such as a stroke, or mass or blood products. No further action is required on this test at this time, other than re-enforcing the importance of good blood pressure control, good cholesterol control, good blood sugar control, and weight management. Please remind patient to keep any upcoming appointments or tests and to call us with any interim questions, concerns, problems or updates. Thanks,  Star Age, MD, PhD    ______

## 2015-08-17 ENCOUNTER — Telehealth: Payer: Self-pay

## 2015-08-17 NOTE — Telephone Encounter (Signed)
-----   Message from Star Age, MD sent at 08/15/2015  4:54 PM EDT ----- Please call patient or sister regarding the recent brain MRI: The brain scan showed a normal structure of the brain and moderate volume loss which we call atrophy. There were changes in the deeper structures of the brain, which we call white matter changes or microvascular changes. These were reported as mild in Her case. These are tiny white spots, that occur with time and are seen in a variety of conditions, including with normal aging, chronic hypertension, chronic headaches, especially migraine HAs, chronic diabetes, chronic hyperlipidemia. These are not strokes and no mass or lesion were seen which is reassuring. Again, there were no acute findings, such as a stroke, or mass or blood products. No further action is required on this test at this time, other than re-enforcing the importance of good blood pressure control, good cholesterol control, good blood sugar control, and weight management. Please remind patient to keep any upcoming appointments or tests and to call us with any interim questions, concerns, problems or updates. Thanks,  Star Age, MD, PhD

## 2015-08-17 NOTE — Telephone Encounter (Signed)
Called patient # and no answer no vm. I called sister and got busy tone. I will try again later.

## 2015-08-18 NOTE — Telephone Encounter (Signed)
LM on sister's phone to call back for results.

## 2015-08-23 DIAGNOSIS — R413 Other amnesia: Secondary | ICD-10-CM | POA: Diagnosis not present

## 2015-08-23 DIAGNOSIS — N183 Chronic kidney disease, stage 3 (moderate): Secondary | ICD-10-CM | POA: Diagnosis not present

## 2015-08-23 DIAGNOSIS — E039 Hypothyroidism, unspecified: Secondary | ICD-10-CM | POA: Diagnosis not present

## 2015-08-29 NOTE — Telephone Encounter (Signed)
I spoke to patient's sister (patients phone had full vm, unable to leave message). I gave results to sister and reminded her of next appt date and time.

## 2015-11-08 ENCOUNTER — Ambulatory Visit (INDEPENDENT_AMBULATORY_CARE_PROVIDER_SITE_OTHER): Payer: Commercial Managed Care - HMO | Admitting: Neurology

## 2015-11-08 ENCOUNTER — Encounter: Payer: Self-pay | Admitting: Neurology

## 2015-11-08 VITALS — BP 130/72 | HR 76 | Resp 16 | Ht 64.0 in | Wt 131.0 lb

## 2015-11-08 DIAGNOSIS — F329 Major depressive disorder, single episode, unspecified: Secondary | ICD-10-CM | POA: Diagnosis not present

## 2015-11-08 DIAGNOSIS — F0390 Unspecified dementia without behavioral disturbance: Secondary | ICD-10-CM

## 2015-11-08 DIAGNOSIS — F32A Depression, unspecified: Secondary | ICD-10-CM

## 2015-11-08 MED ORDER — RIVASTIGMINE 4.6 MG/24HR TD PT24: 4.6000 mg | MEDICATED_PATCH | Freq: Every day | TRANSDERMAL | 3 refills | Status: DC

## 2015-11-08 NOTE — Patient Instructions (Addendum)
We will hold off on the donepezil pills.  We will start for your memory: exelon patch 4.6 mg/24 hour. Place one patch on your skin daily. Remove the old patch before placing a new patch every day, around the same time. Rotate areas on your arms so not to use the same spot every time.  Side effects may include redness from the adhesive, dry eyes, dry mouth, confusion, low pulse, low blood pressure, also GI related side effects (nausea, vomiting, diarrhea, constipation), headaches; rare side effects may include hallucinations and seizures.   Talk to your primary doctor about potentially starting a different antidepressant.   I would recommend you no longer drive as you have reported some confusion while driving.

## 2015-11-08 NOTE — Progress Notes (Signed)
Subjective:    Jessica [name] ID: Jessica Jessica [name] is a 80 y.o. female.  HPI     Interim history:   Jessica Jessica [name] is a 80 year old right-handed woman with an underlying medical history of thyroid disease, vitamin D deficiency, reflux disease, depression, chronic kidney disease, hyperlipidemia, anxiety and overweight state, who presents for follow-up consultation of her memory loss. Jessica Jessica [name] is accompanied by her sister again today. I first met her on 08/08/2015 at Jessica request of her primary care physician, at which time Jessica Jessica [name] reported a several month history to T8 year of memory loss, including short-term memory problems, forgetfulness, misplacing things. Jessica Jessica [name] had no history of behavioral changes. Jessica Jessica [name] had a recent neuropsychological evaluation under Dr. Valentina Shaggy and findings were suggestive of memory loss and unspecified depressive disorder.   Her MMSE was 24/30, clock drawing 3 out of 4, animal fluency 6/m. We talked about her driving. Jessica Jessica [name] was advised about long-term living situation and getting a call alert button. Jessica Jessica [name] was advised to limit her driving. I suggested we start her on donepezil low dose with gradual titration from 5 mg to 10 mg.  I suggested we proceed with a brain MRI. Jessica Jessica [name] had a brain MRI without contrast on 08/15/2015 which I reviewed: IMPRESSION:  This MRI of Jessica brain without contrast shows Jessica following: 1.  Moderate generalized cortical atrophy. 2.  Mild extent of scattered T2/FLAIR hyperintense foci in Jessica white matter of both hemispheres most consistent with chronic microvascular ischemic change. 3.  There are no acute findings.   We tried calling her or her sister for test results.  Today, 11/08/15: Jessica Jessica [name] reports that Jessica Jessica [name] quit taking Jessica donepezil after a few days, as Jessica Jessica [name] once swallowed it and it came back up with watery liquid, but Jessica liquid was red. Jessica Jessica [name] got concerned and stopped Jessica medication altogether. Her sister is in Jessica process of moving in with her. Her driving is not  as good, per sister, as Jessica [name] has told her, "Jessica Jessica [name] gets mixed up".   Previously:   08/08/15: Jessica Jessica [name] reports memory loss for Jessica past few months or up to a year. Jessica Jessica [name] has primarily short-term memory issues, with forgetfulness, misplacing things etc. Jessica Jessica [name] has no history of delusion, hallucinations, or behavioral disturbances.  Jessica Jessica [name] lives by herself. Sister lives nearby and checks on her nearly daily, talks to her every day and stays overnight on Wednesdays and Sundays as they go to Jessica same church and [place] in walking distance from where Jessica Jessica [name] lives.  There is no obvious family history of dementia. Jessica Jessica [name] is single. Jessica Jessica [name] has no children. Their oldest sister and their youngest sister passed away and 2 brothers passed away. Neither one of them had dementia as I understand.    I reviewed your office note from 04/25/2015, which you kindly included. Recent blood work from 02/21/2015 was reviewed as well. CBC with differential was normal, CMP showed no significant abnormality, TSH 2.13, vitamin D level within range.  Jessica Jessica [name] has had flareup in her depression and anxiety.  Jessica Jessica [name] had a neuropsychological evaluation with Dr. Valentina Shaggy on 07/14/2015, and I reviewed Jessica report:    Memory loss [R41.3] Unspecified depressive disorder [F32.9]   Recommendation Consider referral to neurology for further assessment.      Of note, Jessica Jessica [name] went to Jessica emergency room on 07/05/2015 for chest pain. I reviewed Jessica records. EKG and cardiac enzymes were unremarkable. Jessica Jessica [name] was describing a sharp chest pain.  Her Past Medical History Is Significant For: Past  Medical History:  Diagnosis Date  . Anxiety   . CKD (chronic kidney disease), stage III   . GERD without esophagitis   . Hyperlipemia   . Major depressive disorder, single episode, unspecified ([place])   . Memory difficulty   . Thyroid disease   . Vitamin D deficiency     Her Past Surgical History Is Significant For: Past Surgical History:  Procedure Laterality  Date  . BREAST BIOPSY Left   . CATARACT EXTRACTION, BILATERAL    . GLAUCOMA SURGERY    . WRIST SURGERY Left     Her Family History Is Significant For: Family History  Problem Relation Age of Onset  . CAD Father   . CAD Brother   . CVA Paternal Grandmother     Her Social History Is Significant For: Social History   Social History  . Marital status: Single    Spouse name: N/A  . Number of children: 0  . Years of education: college    Occupational History  . Retired    Social History Main Topics  . Smoking status: Never Smoker  . Smokeless tobacco: None  . Alcohol use No  . Drug use: No  . Sexual activity: Not Asked   Other Topics Concern  . None   Social History Narrative   Drinks 1 cup of coffee a day     Her Allergies Are:  No Known Allergies:   Her Current Medications Are:  Outpatient Encounter Prescriptions as of 11/08/2015  Medication Sig  . Cholecalciferol (VITAMIN D3) 2000 units capsule Take by mouth.  . levothyroxine (SYNTHROID, LEVOTHROID) 75 MCG tablet Take 75 mcg by mouth daily before breakfast.  . [DISCONTINUED] donepezil (ARICEPT) 5 MG tablet Take 1 tablet (5 mg total) by mouth at bedtime.   No facility-administered encounter medications on file as of 11/08/2015.   :  Review of Systems:  Out of a complete 14 point review of systems, all are reviewed and negative with Jessica exception of these symptoms as listed below:  Review of Systems  Neurological:       No new concerns per Jessica [name].  Jessica Jessica [name] reports that Jessica Jessica [name] stopped taking Jessica Donepezil after a single episode where after swallowing Jessica pill, it came back up into her mouth with some "very red" liquid. Jessica [name] stopped taking Jessica medication after this.     Objective:  Neurologic Exam  Physical Exam Physical Examination:   Vitals:   11/08/15 0926  BP: 130/72  Pulse: 76  Resp: 16   General Examination: Jessica Jessica [name] is a very pleasant 80 y.o. female in no acute distress. Jessica Jessica [name] appears  well-developed and well-nourished and well groomed. Jessica Jessica [name] is quiet, not very conversant.   HEENT: Normocephalic, atraumatic, pupils are equal, round and reactive to light and accommodation. Funduscopic exam is normal with sharp disc margins noted. Extraocular tracking is good without limitation to gaze excursion or nystagmus noted. Normal smooth pursuit is noted. Hearing appears mildly impaired. Face is symmetric with normal facial animation and normal facial sensation. Speech is clear with no dysarthria noted. There is no hypophonia. There is no lip, neck/head, jaw or voice tremor. Neck is supple with full range of passive and active motion. There are no carotid bruits on auscultation. Oropharynx exam reveals: mild mouth dryness, adequate dental hygiene and mild airway crowding. Mallampati is class II. Tongue protrudes centrally and palate elevates symmetrically.    Chest: Clear to auscultation without wheezing, rhonchi or crackles noted.  Heart: S1+S2+0, regular and normal without murmurs,  rubs or gallops noted.   Abdomen: Soft, non-tender and non-distended with normal bowel sounds appreciated on auscultation.  Extremities: There is trace pitting edema in Jessica distal lower extremities bilaterally. Pedal pulses are intact.  Skin: Warm and dry without trophic changes noted.   Musculoskeletal: exam reveals no obvious joint deformities, tenderness or joint swelling or erythema.   Neurologically:  Mental status: Jessica Jessica [name] is awake, alert and oriented in all 4 spheres. Her immediate and remote memory, attention, language skills and fund of knowledge are impaired. Jessica Jessica [name] is not able to give a concise Hx.sThere is no evidence of aphasia, agnosia, apraxia or anomia. Speech is clear with normal prosody and enunciation. Thought process is linear. Mood is decreased range and affect is blunted.   On 08/08/2015: MMSE: 24/30, clock drawing 3 out of 4, animal fluency 6/m.  Cranial nerves II - XII are as described  above under HEENT exam. In addition: shoulder shrug is normal with equal shoulder height noted. Motor exam: Normal bulk, strength and tone is noted. There is no drift, tremor or rebound. Romberg is negative. Reflexes are 1+ throughout. Fine motor skills and coordination: intact with normal finger taps, normal hand movements, normal rapid alternating patting, normal foot taps and normal foot agility.  Cerebellar testing: No dysmetria or intention tremor on finger to nose testing. Heel to shin is unremarkable bilaterally. There is no truncal or gait ataxia.  Sensory exam: intact in Jessica upper and lower extremities.  Gait, station and balance: Jessica Jessica [name] stands with no significant difficulty but stands up slowly. Jessica Jessica [name] has no problems walking and walks without a walking aid. Balance is slightly impaired.   Assessment and Plan:    In summary, KALKIDAN CAUDELL is a very pleasant 80 year old female with an underlying medical history of thyroid disease, vitamin D deficiency, reflux disease, depression, chronic kidney disease, hyperlipidemia, anxiety and overweight state, who presents for follow up consultation of her memory loss of several months duration, maybe over a year, unclear. Jessica Jessica [name] had undergone recent neuropsychological testing with no definitive diagnosis of dementia but concern for memory loss and depressive disorder. I am glad to hear that her sister, Jessica Jessica [name], is in Jessica process of moving in with Jessica Jessica [name]. Jessica Jessica [name] had had some confusion driving. Jessica Jessica [name] is advised to no longer drive. Jessica Jessica [name] has not been on medication for depression recently. Jessica Jessica [name] stopped generic Prozac and also more recently has stopped her sertraline. Apparently, Jessica Jessica [name] did have side effects on Jessica Prozac but is not sure as to why Jessica Jessica [name] stopped Jessica sertraline. Jessica Jessica [name] is encouraged to talk to her PCP about potentially starting a different antidepressant. I'm worried about her driving and am also worried about her medication compliance.  Unfortunately, Jessica Jessica [name] felt Jessica Jessica [name]  had side effects with Jessica donepezil as Jessica Jessica [name] had some red watery liquid come back up after Jessica Jessica [name] took it once, stop Jessica medication altogether after less than 2 weeks, does not wish to go back on it.I explained to her that it could've been something Jessica Jessica [name] ate, a popsicle, Jell-O, even a small candy, mouthwash, etc. that Jessica Jessica [name] may have seen, but Jessica [name] is adamant that Jessica Jessica [name] would not like to try going back on Jessica donepezil. Therefore, I suggested we could try Exelon patch, starting with 4.6 mg strength. I went over this new medication and Jessica patch delivery system in detail with Jessica Jessica [name] and her sister explaining how to remove Jessica patch, and how to change Jessica patch every day, Jessica importance of removing Jessica old patch first  and putting a new patch on around Jessica same time every day and rotating areas, so not to use Jessica same skin area back to back. We went over potential side effects as well. Her brain MRI moderate generalized atrophy and mild white matter changes.  I had a long chat with Jessica Jessica [name] and Jessica Jessica [name] about my findings and Jessica diagnosis of memory loss and dementia, its prognosis and treatment options. Implications of diagnosis explained at length with Jessica Jessica [name]. We talked about medical treatments and non-pharmacological approaches. Jessica Jessica [name] is advised to stay active mentally and physically. Jessica Jessica [name] likes to sit and read a lot. Jessica Jessica [name] does not like to watch TV. Jessica Jessica [name] is advised to stay better hydrated with water as Jessica Jessica [name] still may not drink enough. Appetite is good. I encouraged Jessica Jessica [name] to eat healthy, exercise daily and keep well hydrated, to keep a scheduled bedtime and wake time routine. I stressed Jessica importance of regular exercise, within of course Jessica Jessica [name]'s own mobility limitations.  I would like to see her back in about 3 to 4 months, sooner if needed. I answered all their questions today and Jessica Jessica [name] and her sister were in agreement.  I encouraged him strongly to call with any interim questions or problems.  I  spent 40 minutes in total face-to-face time with Jessica Jessica [name], more than 50% of which was spent in counseling and coordination of care, reviewing test results, reviewing medication and discussing or reviewing Jessica diagnosis of dementia, its prognosis and treatment options.

## 2015-11-14 ENCOUNTER — Telehealth: Payer: Self-pay | Admitting: Neurology

## 2015-11-14 DIAGNOSIS — F0390 Unspecified dementia without behavioral disturbance: Secondary | ICD-10-CM

## 2015-11-14 NOTE — Telephone Encounter (Signed)
Hinton Dyer, can we get assistance for this?

## 2015-11-14 NOTE — Telephone Encounter (Signed)
Pt's sister called said rivastigmine (EXELON) 4.6 mg/24hr ins will not cover this medication and will cost $600. Please call

## 2015-11-14 NOTE — Telephone Encounter (Signed)
Called Mickey and left her a message asking her to call the office back at [phone] I will see if the family want's to do Patient assistance . For Exelon Patch. Thanks Hinton Dyer.

## 2015-11-15 NOTE — Telephone Encounter (Signed)
Pt's sister [name] returned your call about the Rx Exelon. Please call back.  Thanks!

## 2015-11-16 MED ORDER — RIVASTIGMINE TARTRATE 1.5 MG PO CAPS: 1.5000 mg | ORAL_CAPSULE | Freq: Two times a day (BID) | ORAL | 5 refills | Status: DC

## 2015-11-16 NOTE — Telephone Encounter (Signed)
Called and spoke to [place] patient's sister and she does not want to move forward with patient assistance. Sister is asking can patient have a cheaper RX. Sister was very confused when I speaking to her . I asked her at the end of call did she understand what I was saying to her she relayed yes. Thanks Hinton Dyer .

## 2015-11-16 NOTE — Telephone Encounter (Signed)
We can go with generic exelon caps 1.5 mg bid, Rx sent to pharm, please notify sister.

## 2015-11-16 NOTE — Telephone Encounter (Signed)
Patient called back and I advised her of new prescription. She voiced understanding and will call back if needed.

## 2015-11-16 NOTE — Telephone Encounter (Signed)
Exelon 1.5mg  cap is available BID

## 2015-11-16 NOTE — Telephone Encounter (Signed)
Called sister and left message, also called patient # and left message. Asked for call back. Left message that we have sent in a new Rx to replace the patches, hopefully this is cheaper.

## 2015-11-16 NOTE — Addendum Note (Signed)
Addended by: Star Age on: 11/16/2015 02:38 PM   Modules accepted: Orders

## 2015-12-23 DIAGNOSIS — H43813 Vitreous degeneration, bilateral: Secondary | ICD-10-CM | POA: Diagnosis not present

## 2015-12-23 DIAGNOSIS — H52203 Unspecified astigmatism, bilateral: Secondary | ICD-10-CM | POA: Diagnosis not present

## 2015-12-23 DIAGNOSIS — Z961 Presence of intraocular lens: Secondary | ICD-10-CM | POA: Diagnosis not present

## 2015-12-23 DIAGNOSIS — H4322 Crystalline deposits in vitreous body, left eye: Secondary | ICD-10-CM | POA: Diagnosis not present

## 2016-02-23 DIAGNOSIS — E559 Vitamin D deficiency, unspecified: Secondary | ICD-10-CM | POA: Diagnosis not present

## 2016-02-23 DIAGNOSIS — E785 Hyperlipidemia, unspecified: Secondary | ICD-10-CM | POA: Diagnosis not present

## 2016-02-23 DIAGNOSIS — R413 Other amnesia: Secondary | ICD-10-CM | POA: Diagnosis not present

## 2016-02-23 DIAGNOSIS — F419 Anxiety disorder, unspecified: Secondary | ICD-10-CM | POA: Diagnosis not present

## 2016-02-23 DIAGNOSIS — E039 Hypothyroidism, unspecified: Secondary | ICD-10-CM | POA: Diagnosis not present

## 2016-02-23 DIAGNOSIS — R0600 Dyspnea, unspecified: Secondary | ICD-10-CM | POA: Diagnosis not present

## 2016-02-23 DIAGNOSIS — Z23 Encounter for immunization: Secondary | ICD-10-CM | POA: Diagnosis not present

## 2016-02-23 DIAGNOSIS — N183 Chronic kidney disease, stage 3 (moderate): Secondary | ICD-10-CM | POA: Diagnosis not present

## 2016-03-12 ENCOUNTER — Encounter: Payer: Self-pay | Admitting: Neurology

## 2016-03-12 ENCOUNTER — Ambulatory Visit (INDEPENDENT_AMBULATORY_CARE_PROVIDER_SITE_OTHER): Payer: Commercial Managed Care - HMO | Admitting: Neurology

## 2016-03-12 VITALS — BP 133/74 | HR 72 | Resp 20 | Ht 60.0 in | Wt 137.0 lb

## 2016-03-12 DIAGNOSIS — R6889 Other general symptoms and signs: Secondary | ICD-10-CM

## 2016-03-12 DIAGNOSIS — M21612 Bunion of left foot: Secondary | ICD-10-CM

## 2016-03-12 DIAGNOSIS — M21611 Bunion of right foot: Secondary | ICD-10-CM

## 2016-03-12 DIAGNOSIS — L84 Corns and callosities: Secondary | ICD-10-CM

## 2016-03-12 DIAGNOSIS — M2041 Other hammer toe(s) (acquired), right foot: Secondary | ICD-10-CM | POA: Diagnosis not present

## 2016-03-12 DIAGNOSIS — F0390 Unspecified dementia without behavioral disturbance: Secondary | ICD-10-CM | POA: Diagnosis not present

## 2016-03-12 MED ORDER — RIVASTIGMINE TARTRATE 1.5 MG PO CAPS: 1.5000 mg | ORAL_CAPSULE | Freq: Two times a day (BID) | ORAL | 3 refills | Status: DC

## 2016-03-12 NOTE — Progress Notes (Signed)
Subjective:    Jessica [name] ID: Jessica Jessica [name] is a 80 y.o. female.  HPI    Interim history:   Jessica Jessica [name] is a 80 year old right-handed woman with an underlying medical history of thyroid disease, vitamin D deficiency, reflux disease, depression, chronic kidney disease, hyperlipidemia, anxiety and overweight state, who presents for follow-up consultation of her memory loss. Jessica Jessica [name] is accompanied by her sister, Jessica Jessica [name], again today. I last saw her on , at which time she reported that she quit taking Jessica generic Aricept after a few days as she had some vomiting or regurgitation once with it and she did not want to take it. Her sister was in Jessica process of moving in with her. I prescribed Exelon patch which she could not afford and I changed it to Exelon generic capsules.   Today, 03/12/2016: She reports doing okay, no specific complaints. Cannot tell, why she did not fill Jessica Refill is significant capsules. Her sister states that when Jessica pharmacy or mail order pharmacy called Jessica Jessica [name] herself picked up Jessica phone and declined Jessica prescription, unclear if it was for cost or other reasons. Nevertheless, she has had no other medication changes. She is bothered by painful corn on her second toe on Jessica right. Years ago she stopped a podiatrist and would be willing to see another podiatrist again. She has trouble some foot pain right more than left, hammertoe and bunions bilaterally. She has not had a recent fall. She did have 2 episodes of not feeling well, maybe lightheadedness or dizziness, she herself is not able to describe but sister reports that she had 2 episodes in Jessica past month where she was not looking well, could not elaborate as to what she was feeling but looked like she was not fully attentive, had a staring spell, no actual seizure, may have been upset about something but did not get checked out, sister states that Jessica episodes lasted just a few minutes and she was fine after sitting down  and if she had called 911 by Jessica time EMS came she would've been back to baseline. Sister states that she has not had any other episodes since then. Jessica Jessica [name] moved in in September to live with Jessica [name].   Previously:    I first met her on 08/08/2015 at Jessica request of her primary care physician, at which time Jessica Jessica [name] reported a several month history to T8 year of memory loss, including short-term memory problems, forgetfulness, misplacing things. She had no history of behavioral changes. She had a recent neuropsychological evaluation under Dr. Valentina Shaggy and findings were suggestive of memory loss and unspecified depressive disorder.    Her MMSE was 24/30, clock drawing 3 out of 4, animal fluency 6/m. We talked about her driving. She was advised about long-term living situation and getting a call alert button. She was advised to limit her driving. I suggested we start her on donepezil low dose with gradual titration from 5 mg to 10 mg.   I suggested we proceed with a brain MRI. She had a brain MRI without contrast on 08/15/2015 which I reviewed: IMPRESSION:  This MRI of Jessica brain without contrast shows Jessica following: 1.  Moderate generalized cortical atrophy. 2.  Mild extent of scattered T2/FLAIR hyperintense foci in Jessica white matter of both hemispheres most consistent with chronic microvascular ischemic change. 3.  There are no acute findings.   We tried calling her or her sister for test results.   08/08/15: She reports memory loss for  Jessica past few months or up to a year. She has primarily short-term memory issues, with forgetfulness, misplacing things etc. She has no history of delusion, hallucinations, or behavioral disturbances.  She lives by herself. Sister lives nearby and checks on her nearly daily, talks to her every day and stays overnight on Wednesdays and Sundays as they go to Jessica same church and [place] in walking distance from where Jessica Jessica [name] lives.  There is no obvious family  history of dementia. Jessica Jessica [name] is single. She has no children. Their oldest sister and their youngest sister passed away and 2 brothers passed away. Neither one of them had dementia as I understand.    I reviewed your office note from 04/25/2015, which you kindly included. Recent blood work from 02/21/2015 was reviewed as well. CBC with differential was normal, CMP showed no significant abnormality, TSH 2.13, vitamin D level within range.  She has had flareup in her depression and anxiety.  She had a neuropsychological evaluation with Dr. Valentina Shaggy on 07/14/2015, and I reviewed Jessica report:    Memory loss [R41.3] Unspecified depressive disorder [F32.9]   Recommendation Consider referral to neurology for further assessment.     Of note, she went to Jessica emergency room on 07/05/2015 for chest pain. I reviewed Jessica records. EKG and cardiac enzymes were unremarkable. She was describing a sharp chest pain.  Her Past Medical History Is Significant For: Past Medical History:  Diagnosis Date  . Anxiety   . CKD (chronic kidney disease), stage III   . GERD without esophagitis   . Hyperlipemia   . Major depressive disorder, single episode, unspecified   . Memory difficulty   . Thyroid disease   . Vitamin D deficiency     Her Past Surgical History Is Significant For: Past Surgical History:  Procedure Laterality Date  . BREAST BIOPSY Left   . CATARACT EXTRACTION, BILATERAL    . GLAUCOMA SURGERY    . WRIST SURGERY Left     Her Family History Is Significant For: Family History  Problem Relation Age of Onset  . CAD Father   . CAD Brother   . CVA Paternal Grandmother     Her Social History Is Significant For: Social History   Social History  . Marital status: Single    Spouse name: N/A  . Number of children: 0  . Years of education: college    Occupational History  . Retired    Social History Main Topics  . Smoking status: Never Smoker  . Smokeless tobacco: None  . Alcohol use  No  . Drug use: No  . Sexual activity: Not Asked   Other Topics Concern  . None   Social History Narrative   Drinks 1 cup of coffee a day     Her Allergies Are:  No Known Allergies:   Her Current Medications Are:  Outpatient Encounter Prescriptions as of 03/12/2016  Medication Sig  . Cholecalciferol (VITAMIN D3) 2000 units capsule Take by mouth.  . levothyroxine (SYNTHROID, LEVOTHROID) 75 MCG tablet Take 75 mcg by mouth daily before breakfast.  . sertraline (ZOLOFT) 50 MG tablet Take 50 mg by mouth daily.  . [DISCONTINUED] rivastigmine (EXELON) 1.5 MG capsule Take 1 capsule (1.5 mg total) by mouth 2 (two) times daily.  . [DISCONTINUED] rivastigmine (EXELON) 4.6 mg/24hr Place 1 patch (4.6 mg total) onto Jessica skin daily.   No facility-administered encounter medications on file as of 03/12/2016.   :  Review of Systems:  Out  of a complete 14 point review of systems, all are reviewed and negative with Jessica exception of these symptoms as listed below:  Review of Systems  Neurological:       Pt presents today for a follow up on memory. Pt could not afford Jessica exelon capsule nor patch because insurance did not approve it and therefore has not taken it.    Objective:  Neurologic Exam  Physical Exam Physical Examination:   Vitals:   03/12/16 0944  BP: 133/74  Pulse: 72  Resp: 20   General Examination: Jessica Jessica [name] is a very pleasant 80 y.o. female in no acute distress. She appears well-developed and well-nourished and well groomed. She is quiet, not very conversant.   HEENT: Normocephalic, atraumatic, pupils are equal, round and reactive to light and accommodation. Extraocular tracking is good without limitation to gaze excursion or nystagmus noted. Normal smooth pursuit is noted. Hearing appears mildly impaired. Face is symmetric with normal facial animation and normal facial sensation. Speech is clear with no dysarthria noted. There is no hypophonia. There is no lip, neck/head, jaw  or voice tremor. Neck is supple with full range of passive and active motion. There are no carotid bruits on auscultation. Oropharynx exam reveals: mild mouth dryness, adequate dental hygiene and mild airway crowding. Mallampati is class II. Tongue protrudes centrally and palate elevates symmetrically.    Chest: Clear to auscultation without wheezing, rhonchi or crackles noted.  Heart: S1+S2+0, regular and normal without murmurs, rubs or gallops noted.   Abdomen: Soft, non-tender and non-distended with normal bowel sounds appreciated on auscultation.  Extremities: There is trace pitting edema in Jessica distal lower extremities bilaterally. Pedal pulses are intact.  Skin: Warm and dry without trophic changes noted.   Musculoskeletal: exam reveals bilateral bunion, right second hammertoe and hard corn on Jessica top of Jessica hammertoe.    Neurologically:  Mental status: Jessica Jessica [name] is awake, alert and oriented in all 4 spheres. Her immediate and remote memory, attention, language skills and fund of knowledge are impaired. She is not able to give a concise Hx.sThere is no evidence of aphasia, agnosia, apraxia or anomia. Speech is clear with normal prosody and enunciation. Thought process is linear. Mood is decreased range and affect is blunted.   On 08/08/2015: MMSE: 24/30, clock drawing 3 out of 4, animal fluency 6/m.  On 03/12/2016: MMSE: 19/30, CDT: 3/4, AFT: 6/min.  Cranial nerves II - XII are as described above under HEENT exam. In addition: shoulder shrug is normal with equal shoulder height noted. Motor exam: Normal bulk, strength and tone is noted. There is no drift, tremor or rebound. Romberg is negative. Reflexes are 1+ throughout. Fine motor skills and coordination:  mildly impaired globally.  Cerebellar testing: No dysmetria or intention tremor on finger to nose testing. Heel to shin is unremarkable bilaterally. There is no truncal or gait ataxia.  Sensory exam: intact in Jessica upper and lower  extremities.  Gait, station and balance: She stands with no significant difficulty but stands up slowly stands slightly wide-based, walks without a cane but cautiously, posture mildly stooped, could be appropriate for age, balance slightly impaired, mostly stable exam.   Assessment and Plan:    In summary, Jessica Jessica [name] is a very pleasant 80 year old female with an underlying medical history of thyroid disease, vitamin D deficiency, reflux disease, depression, chronic kidney disease, hyperlipidemia, anxiety and overweight state, who presents for follow up consultation of her memory loss. History and exam in keeping with  dementia without behavioral disturbance, workup in Jessica form of brain MRI and neuropsychological testing was supportive, although neuropsychological testing with no definitive diagnosis of dementia but concern for memory loss and depressive disorder. Jessica Jessica [name] moved in a couple months ago. Jessica [name] was advised in Jessica past to no longer drive. She had stopped taking her antidepressant. She had also stopped taking donepezil  because she felt she had side effects but I'm not quite sure if she truly had side effects. She did report that one time Jessica pill came back up and it had red watery liquid with it.  she was quite adamant that she would not try Jessica medication again so we stopped it and I suggested Exelon patch which she could not afford but I switched to Exelon generic capsules after that and she did not fill it for unclear reasons. I reminded Jessica [name] that we should give it a try and we will start low and advance slowly if tolerated. Her memory scores have declined some in Jessica past 6 months and we should try to utilize medication if possible. Her brain MRI moderate generalized atrophy and mild white matter changes.  I had a long chat with Jessica Jessica [name] and Jessica Jessica [name] about my findings and Jessica diagnosis of memory loss and dementia, its prognosis and treatment options. Implications of diagnosis  explained at length with Jessica Jessica [name] and sister.  sister describes 2 brief episodes of decreased attentiveness. Unclear what exactly happened as there is not a whole lot of other description and Jessica [name] does not recall these episodes and is not able to describe them. Nevertheless, I suggested we proceed with an EEG. Jessica [name] was checked out by primary care physician and per sister had an EKG which was unremarkable per sister's report. We will call them with Jessica EEG results.  I would like to see her back in about 3 months, sooner if needed. I answered all their questions today and Jessica Jessica [name] and her sister were in agreement.  I encouraged him strongly to call with any interim questions or problems.  I spent 30 minutes in total face-to-face time with Jessica Jessica [name], more than 50% of which was spent in counseling and coordination of care, reviewing test results, reviewing medication and discussing or reviewing Jessica diagnosis of dementia, its prognosis and treatment options.

## 2016-03-12 NOTE — Patient Instructions (Addendum)
Your memory scores have declined a little. I really would like for you to start the Exelon capsules, which are hopefully help you with your memory in the long run.  The generic name is rivastigmine 1.5 mg: take 1 pill 2 times a day.   Side effects may include: dry eyes, dry mouth, confusion, low pulse, low blood pressure, also GI related side effects (nausea, vomiting, diarrhea, constipation), headaches; rare side effects may include hallucinations and seizures.  We will start low and increase slowly. We will see you back in 3 months for a check up. I will make a referral to podiatry for your hammertoe and corn on the right foot.   We will do an EEG (brainwave test), which we will schedule. We will call you with the results.

## 2016-03-16 ENCOUNTER — Telehealth: Payer: Self-pay | Admitting: Neurology

## 2016-03-16 DIAGNOSIS — F0390 Unspecified dementia without behavioral disturbance: Secondary | ICD-10-CM

## 2016-03-16 MED ORDER — RIVASTIGMINE TARTRATE 1.5 MG PO CAPS: 1.5000 mg | ORAL_CAPSULE | Freq: Two times a day (BID) | ORAL | 5 refills | Status: DC

## 2016-03-16 NOTE — Telephone Encounter (Signed)
I called patient's sister back. She says [facility] is [facility] [address] in [place]. Tel# [phone].

## 2016-03-16 NOTE — Telephone Encounter (Signed)
[facility] find out which [facility] in [place].

## 2016-03-16 NOTE — Telephone Encounter (Signed)
Patient's sister is calling to get a new Rx for rivastigmine (EXELON) 1.5 MG capsule called to [facility] [place]. It was originally sent to mail order pharmacy but since the patient does not have a credit card could not be filled.

## 2016-03-16 NOTE — Addendum Note (Signed)
Addended by: Star Age on: 03/16/2016 11:34 AM   Modules accepted: Orders

## 2016-03-16 NOTE — Addendum Note (Signed)
Addended by: Star Age on: 03/16/2016 10:56 AM   Modules accepted: Orders

## 2016-03-16 NOTE — Telephone Encounter (Signed)
Exelon Rx changed from mail order to [facility] [address] in [place], per patient's sister's request.

## 2016-03-21 ENCOUNTER — Ambulatory Visit (INDEPENDENT_AMBULATORY_CARE_PROVIDER_SITE_OTHER): Payer: Commercial Managed Care - HMO | Admitting: Podiatry

## 2016-03-21 ENCOUNTER — Encounter: Payer: Self-pay | Admitting: Podiatry

## 2016-03-21 VITALS — Ht 60.0 in | Wt 137.0 lb

## 2016-03-21 DIAGNOSIS — B351 Tinea unguium: Secondary | ICD-10-CM | POA: Diagnosis not present

## 2016-03-21 DIAGNOSIS — M2041 Other hammer toe(s) (acquired), right foot: Secondary | ICD-10-CM

## 2016-03-21 DIAGNOSIS — M2042 Other hammer toe(s) (acquired), left foot: Secondary | ICD-10-CM

## 2016-03-21 DIAGNOSIS — M79672 Pain in left foot: Secondary | ICD-10-CM

## 2016-03-21 DIAGNOSIS — M2010 Hallux valgus (acquired), unspecified foot: Secondary | ICD-10-CM | POA: Diagnosis not present

## 2016-03-21 DIAGNOSIS — M79671 Pain in right foot: Secondary | ICD-10-CM

## 2016-03-21 NOTE — Progress Notes (Signed)
SUBJECTIVE: 80 y.o. year old female presents requesting toe nails trimmed. They are hard as rock and has painful corn. Patient wants to know if the 2nd toe can be fixed.  Patient is a hard of hearing.   REVIEW OF SYSTEMS: Pertinent items noted in HPI and remainder of comprehensive ROS otherwise negative.  OBJECTIVE: DERMATOLOGIC EXAMINATION: Thick dystrophic nails x 10. VASCULAR EXAMINATION OF LOWER LIMBS: All pedal pulses are palpable with normal pulsation.  Capillary Filling times within 3 seconds in all digits. No edema or erythema noted. Temperature gradient from tibial crest to dorsum of foot is within normal bilateral.  NEUROLOGIC EXAMINATION OF THE LOWER LIMBS: Achilles DTR is present and within normal. Monofilament (Semmes-Weinstein 10-gm) sensory testing fail to respond bilateral. Vibratory sensations(128Hz  turning fork) fail to respond bilateral.  Hervey Ard and Dull discriminatory sensations at the plantar ball of hallux is intact bilateral.   MUSCULOSKELETAL EXAMINATION: Positive for severe Valgus deformity of both great toe sitting under the 2nd toe bilateral. Medially deviated 2nd toe with dorsal digital corn over PIPJ due to under riding hallux.   ASSESSMENT: Subluxed 1st and 2nd MPJ bilateral. Severe HAV with bunion deformity bilateral. Severe Hammer toe deformity with digital corns. Mycotic nails x 10. Pain in both feet.  PLAN: Reviewed clinical findings and available treatment options. Advised palliative care for deformed 2nd toe due to severity and her age.  All lesions debrided. Tube form gauze placed over the 2nd digit right. All nails debrided. Return in 3 moths.

## 2016-03-21 NOTE — Patient Instructions (Addendum)
Seen for hypertrophic nails and painful corn. All nails and corns debrided. Return in 3 months or as needed.  

## 2016-03-22 ENCOUNTER — Ambulatory Visit: Payer: Commercial Managed Care - HMO | Admitting: Podiatry

## 2016-04-09 HISTORY — PX: LIVER BIOPSY: SHX301

## 2016-04-18 ENCOUNTER — Ambulatory Visit (INDEPENDENT_AMBULATORY_CARE_PROVIDER_SITE_OTHER): Payer: Medicare HMO | Admitting: Diagnostic Neuroimaging

## 2016-04-18 DIAGNOSIS — R410 Disorientation, unspecified: Secondary | ICD-10-CM

## 2016-04-18 DIAGNOSIS — R6889 Other general symptoms and signs: Secondary | ICD-10-CM

## 2016-04-18 DIAGNOSIS — F0390 Unspecified dementia without behavioral disturbance: Secondary | ICD-10-CM

## 2016-04-24 ENCOUNTER — Telehealth: Payer: Self-pay

## 2016-04-24 NOTE — Telephone Encounter (Signed)
-----   Message from Star Age, MD sent at 04/24/2016  8:05 AM EST ----- Please call and advise the patient or caretaker that the EEG or brain wave test we performed was reported as normal in the awake and drowsy states. We checked for abnormal electrical discharges in the brain waves and the report suggested normal findings. No further action is required on this test at this time. Please remind patient to keep any upcoming appointments or tests and to call us with any interim questions, concerns, problems or updates. Thanks,  Star Age, MD, PhD

## 2016-04-24 NOTE — Progress Notes (Signed)
Please call and advise the patient or caretaker that the EEG or brain wave test we performed was reported as normal in the awake and drowsy states. We checked for abnormal electrical discharges in the brain waves and the report suggested normal findings. No further action is required on this test at this time. Please remind patient to keep any upcoming appointments or tests and to call us with any interim questions, concerns, problems or updates. Thanks,  Star Age, MD, PhD

## 2016-04-24 NOTE — Telephone Encounter (Signed)
I spoke to her sister and she is aware of the results and recommendations.

## 2016-05-08 ENCOUNTER — Emergency Department (HOSPITAL_BASED_OUTPATIENT_CLINIC_OR_DEPARTMENT_OTHER): Payer: Medicare HMO

## 2016-05-08 ENCOUNTER — Emergency Department (HOSPITAL_BASED_OUTPATIENT_CLINIC_OR_DEPARTMENT_OTHER)
Admission: EM | Admit: 2016-05-08 | Discharge: 2016-05-08 | Disposition: A | Discharge status: Home / Self Care | Payer: Medicare HMO | Attending: Emergency Medicine | Admitting: Emergency Medicine

## 2016-05-08 ENCOUNTER — Encounter (HOSPITAL_BASED_OUTPATIENT_CLINIC_OR_DEPARTMENT_OTHER): Payer: Self-pay | Admitting: *Deleted

## 2016-05-08 DIAGNOSIS — Y999 Unspecified external cause status: Secondary | ICD-10-CM | POA: Insufficient documentation

## 2016-05-08 DIAGNOSIS — K0889 Other specified disorders of teeth and supporting structures: Secondary | ICD-10-CM

## 2016-05-08 DIAGNOSIS — Y939 Activity, unspecified: Secondary | ICD-10-CM | POA: Diagnosis not present

## 2016-05-08 DIAGNOSIS — R51 Headache: Secondary | ICD-10-CM | POA: Diagnosis not present

## 2016-05-08 DIAGNOSIS — S032XXA Dislocation of tooth, initial encounter: Secondary | ICD-10-CM | POA: Diagnosis not present

## 2016-05-08 DIAGNOSIS — S01511A Laceration without foreign body of lip, initial encounter: Secondary | ICD-10-CM | POA: Insufficient documentation

## 2016-05-08 DIAGNOSIS — S8002XA Contusion of left knee, initial encounter: Secondary | ICD-10-CM | POA: Insufficient documentation

## 2016-05-08 DIAGNOSIS — S0990XA Unspecified injury of head, initial encounter: Secondary | ICD-10-CM | POA: Diagnosis not present

## 2016-05-08 DIAGNOSIS — N183 Chronic kidney disease, stage 3 (moderate): Secondary | ICD-10-CM | POA: Insufficient documentation

## 2016-05-08 DIAGNOSIS — W010XXA Fall on same level from slipping, tripping and stumbling without subsequent striking against object, initial encounter: Secondary | ICD-10-CM | POA: Diagnosis not present

## 2016-05-08 DIAGNOSIS — Y929 Unspecified place or not applicable: Secondary | ICD-10-CM | POA: Insufficient documentation

## 2016-05-08 NOTE — ED Triage Notes (Addendum)
She slipped on ice and fell this am. Loose tooth. Abrasions to her nose and mouth.

## 2016-05-08 NOTE — Discharge Instructions (Signed)
Follow-up with your dentist as they need to secure this tooth in place.  Tylenol 1-2 tabs po q4h prn

## 2016-05-08 NOTE — ED Provider Notes (Signed)
[place] DEPT [facility] Provider Note   CSN: [id number] Arrival date & time: 05/08/16  1151     History   Chief Complaint Chief Complaint  Patient presents with  . Fall  . Mouth Injury    HPI Jessica [name] is a 81 y.o. female.  81 yo F with a chief complaints of a fall. Patient slipped on the ice this morning landing on her knees and then landed on her face. Complaining mostly of a loose tooth. Was able to ambulate afterwards without any difficulty. Denies chest pain abdominal pain back pain. Denies head injury loss of consciousness.   The history is provided by the patient.  Fall  This is a new problem. The current episode started 3 to 5 hours ago. The problem occurs constantly. The problem has not changed since onset.Pertinent negatives include no chest pain, no headaches and no shortness of breath. Nothing aggravates the symptoms. Nothing relieves the symptoms. She has tried nothing for the symptoms. The treatment provided no relief.  Mouth Injury  Pertinent negatives include no chest pain, no headaches and no shortness of breath.    Past Medical History:  Diagnosis Date  . Anxiety   . CKD (chronic kidney disease), stage III   . GERD without esophagitis   . Hyperlipemia   . Major depressive disorder, single episode, unspecified   . Memory difficulty   . Thyroid disease   . Vitamin D deficiency     There are no active problems to display for this patient.   Past Surgical History:  Procedure Laterality Date  . BREAST BIOPSY Left   . CATARACT EXTRACTION, BILATERAL    . GLAUCOMA SURGERY    . WRIST SURGERY Left     OB History    No data available       Home Medications    Prior to Admission medications   Medication Sig Start Date End Date Taking? Authorizing Provider  Cholecalciferol (VITAMIN D3) 2000 units capsule Take by mouth.    Historical Provider, MD  levothyroxine (SYNTHROID, LEVOTHROID) 75 MCG tablet Take 75 mcg by mouth daily before  breakfast.    Historical Provider, MD  rivastigmine (EXELON) 1.5 MG capsule Take 1 capsule (1.5 mg total) by mouth 2 (two) times daily. 03/16/16   Star Age, MD  sertraline (ZOLOFT) 50 MG tablet Take 50 mg by mouth daily. 02/24/16   Historical Provider, MD    Family History Family History  Problem Relation Age of Onset  . CAD Father   . CAD Brother   . CVA Paternal Grandmother     Social History Social History  Substance Use Topics  . Smoking status: Never Smoker  . Smokeless tobacco: Never Used  . Alcohol use No     Allergies   Patient has no known allergies.   Review of Systems Review of Systems  Constitutional: Negative for chills and fever.  HENT: Positive for dental problem. Negative for congestion and rhinorrhea.   Eyes: Negative for redness and visual disturbance.  Respiratory: Negative for shortness of breath and wheezing.   Cardiovascular: Negative for chest pain and palpitations.  Gastrointestinal: Negative for nausea and vomiting.  Genitourinary: Negative for dysuria and urgency.  Musculoskeletal: Negative for arthralgias and myalgias.  Skin: Negative for pallor and wound.  Neurological: Negative for dizziness and headaches.     Physical Exam Updated Vital Signs BP 138/77   Pulse 78   Temp 98 F (36.7 C) (Oral)   Resp 20   Ht 5'  2" (1.575 m)   Wt 137 lb (62.1 kg)   SpO2 100%   BMI 25.06 kg/m   Physical Exam  Constitutional: She is oriented to person, place, and time. She appears well-developed and well-nourished. No distress.  HENT:  Head: Normocephalic and atraumatic.  Left frontal Incisor with mild laxity.  Eyes: EOM are normal. Pupils are equal, round, and reactive to light.  Neck: Normal range of motion. Neck supple.  Cardiovascular: Normal rate and regular rhythm.  Exam reveals no gallop and no friction rub.   No murmur heard. Pulmonary/Chest: Effort normal. She has no wheezes. She has no rales.  Abdominal: Soft. She exhibits no  distension. There is no tenderness.  Musculoskeletal: She exhibits tenderness (bruise to the leftanterior knee. Full range of motion without tenderness.). She exhibits no edema.  Patient was palpated from head to toe with no other noted areas of bony tenderness.  Neurological: She is alert and oriented to person, place, and time.  Skin: Skin is warm and dry. She is not diaphoretic.  Psychiatric: She has a normal mood and affect. Her behavior is normal.  Nursing note and vitals reviewed.    ED Treatments / Results  Labs (all labs ordered are listed, but only abnormal results are displayed) Labs Reviewed - No data to display  EKG  EKG Interpretation None       Radiology Ct Head Wo Contrast  Result Date: 05/08/2016 CLINICAL DATA:  Status post fall this morning.  Frontal pain. EXAM: CT HEAD WITHOUT CONTRAST TECHNIQUE: Contiguous axial images were obtained from the base of the skull through the vertex without intravenous contrast. COMPARISON:  MR brain 08/15/2015 FINDINGS: Brain: No evidence of acute infarction, hemorrhage, hydrocephalus, extra-axial collection or mass lesion/mass effect. Vascular: No hyperdense vessel or unexpected calcification. Skull: No osseous abnormality. Sinuses/Orbits: Visualized paranasal sinuses are clear. Visualized mastoid sinuses are clear. Visualized orbits demonstrate no focal abnormality. Other: None IMPRESSION: No acute intracranial pathology. Electronically Signed   By: Kathreen Devoid   On: 05/08/2016 13:31    Procedures Procedures (including critical care time)  Medications Ordered in ED Medications - No data to display   Initial Impression / Assessment and Plan / ED Course  I have reviewed the triage vital signs and the nursing notes.  Pertinent labs & imaging results that were available during my care of the patient were reviewed by me and considered in my medical decision making (see chart for details).     81 yo F With a chief complaint of a  mechanical fall. Patient has a subluxed tooth that appears to be in good position. Suggest that she follow-up with her dentist. CT the head without intracranial bleeding. She has mild bruising to the left knee but has full range of motion and is able to ambulate without difficulty. I do not suspect an occult fracture. We'll have her follow-up with her family physician. Discharge home.   1:38 PM:  I have discussed the diagnosis/risks/treatment options with the patient and family and believe the pt to be eligible for discharge home to follow-up with PCP, dentist. We also discussed returning to the ED immediately if new or worsening sx occur. We discussed the sx which are most concerning (e.g., sudden worsening pain, fever, inability to tolerate by mouth) that necessitate immediate return. Medications administered to the patient during their visit and any new prescriptions provided to the patient are listed below.  Medications given during this visit Medications - No data to display   The  patient appears reasonably screen and/or stabilized for discharge and I doubt any other medical condition or other [facility] requiring further screening, evaluation, or treatment in the ED at this time prior to discharge.    Final Clinical Impressions(s) / ED Diagnoses   Final diagnoses:  Lip laceration, initial encounter  Subluxation of tooth    New Prescriptions New Prescriptions   No medications on file     Deno Etienne, DO 05/08/16 1338

## 2016-05-25 DIAGNOSIS — E785 Hyperlipidemia, unspecified: Secondary | ICD-10-CM | POA: Diagnosis not present

## 2016-05-25 DIAGNOSIS — W19XXXD Unspecified fall, subsequent encounter: Secondary | ICD-10-CM | POA: Diagnosis not present

## 2016-05-25 DIAGNOSIS — S0083XD Contusion of other part of head, subsequent encounter: Secondary | ICD-10-CM | POA: Diagnosis not present

## 2016-05-25 DIAGNOSIS — E039 Hypothyroidism, unspecified: Secondary | ICD-10-CM | POA: Diagnosis not present

## 2016-05-25 DIAGNOSIS — R413 Other amnesia: Secondary | ICD-10-CM | POA: Diagnosis not present

## 2016-05-25 DIAGNOSIS — F419 Anxiety disorder, unspecified: Secondary | ICD-10-CM | POA: Diagnosis not present

## 2016-06-14 ENCOUNTER — Encounter: Payer: Self-pay | Admitting: Neurology

## 2016-06-14 ENCOUNTER — Ambulatory Visit (INDEPENDENT_AMBULATORY_CARE_PROVIDER_SITE_OTHER): Payer: Medicare HMO | Admitting: Neurology

## 2016-06-14 VITALS — BP 130/68 | HR 70 | Resp 14 | Ht 60.0 in | Wt 135.0 lb

## 2016-06-14 DIAGNOSIS — F0390 Unspecified dementia without behavioral disturbance: Secondary | ICD-10-CM | POA: Diagnosis not present

## 2016-06-14 MED ORDER — RIVASTIGMINE TARTRATE 3 MG PO CAPS: 3.0000 mg | ORAL_CAPSULE | Freq: Two times a day (BID) | ORAL | 5 refills | Status: DC

## 2016-06-14 NOTE — Progress Notes (Signed)
Subjective:    Patient ID: BILLIJO DILLING is a 81 y.o. female.  HPI     Interim history:  Ms. Carmen is a 81 year old right-handed woman with an underlying medical history of thyroid disease, vitamin D deficiency, reflux disease, depression, chronic kidney disease, hyperlipidemia, anxiety and overweight state, who presents for follow-up consultation of her memory loss. The patient is accompanied by her sister, [name], again today. I last saw her on 03/12/2016, at which time she reported doing okay, she had no specific complaints, but was not taking her memory medication and gave no reason for it. She had foot pain bilaterally, has a history of hammertoes and bunions. She did not have a recent fall. Sr. described 2 episodes of decreased attentiveness and staring, no actual convulsions. Her sister moved in with the patient in September 2017. Her MMSE was 19/30, CDT: 3/4, AFT: 6/min. I suggested she fill her prescription for Exelon capsules. She could not afford Exelon patches and did not tolerate or reported not being able to tolerate Aricept generic. I suggested further workup with EEG. She had this on 04/18/2016. This was reported as normal in the awake and drowsy states. We called with test results.  Today, 06/14/2016 (all dictated new, as well as above notes, some dictation done in note pad or Word, outside of chart, may appear as copied):  She reports doing okay, feels forgetful. Other than that she has not had any recent problems, no other falls. Of note, she unfortunately had a fall in January. She landed on ice and hit her face after landing on her knees. Head CT without contrast showed no acute intracranial abnormality. I reviewed the emergency room records from 05/08/2016. Her sister reports no additional incidence of zoning out spells or staring spells. We talked about the EEG today as well. Sister reports that a couple of times patient did not take her sertraline are the evening dose of  Exelon and when patient was reminded sister states that she almost argued about it. She tried to not argue about it too much but took out the pills and placed him on the counter and when patient finally realized that she did not take them she took them.    The patient's allergies, current medications, family history, past medical history, past social history, past surgical history and problem list were reviewed and updated as appropriate.   Previously (copied from previous notes for reference):   I saw her on 11/08/15, at which time she reported that she quit taking the generic Aricept after a few days as she had some vomiting or regurgitation once with it and she did not want to take it. Her sister was in the process of moving in with her. I prescribed Exelon patch which she could not afford and I changed it to Exelon generic capsules.       I first met her on 08/08/2015 at the request of her primary care physician, at which time the patient reported a several month history to T8 year of memory loss, including short-term memory problems, forgetfulness, misplacing things. She had no history of behavioral changes. She had a recent neuropsychological evaluation under Dr. Valentina Shaggy and findings were suggestive of memory loss and unspecified depressive disorder.    Her MMSE was 24/30, clock drawing 3 out of 4, animal fluency 6/m. We talked about her driving. She was advised about long-term living situation and getting a call alert button. She was advised to limit her driving. I suggested we start  her on donepezil low dose with gradual titration from 5 mg to 10 mg.   I suggested we proceed with a brain MRI. She had a brain MRI without contrast on 08/15/2015 which I reviewed: IMPRESSION:  This MRI of the brain without contrast shows the following: 1.  Moderate generalized cortical atrophy. 2.  Mild extent of scattered T2/FLAIR hyperintense foci in the white matter of both hemispheres most consistent with  chronic microvascular ischemic change. 3.  There are no acute findings.   We tried calling her or her sister for test results.   08/08/15: She reports memory loss for the past few months or up to a year. She has primarily short-term memory issues, with forgetfulness, misplacing things etc. She has no history of delusion, hallucinations, or behavioral disturbances.  She lives by herself. Sister lives nearby and checks on her nearly daily, talks to her every day and stays overnight on Wednesdays and Sundays as they go to the same church and the [place] in walking distance from where the patient lives.  There is no obvious family history of dementia. The patient is single. She has no children. Their oldest sister and their youngest sister passed away and 2 brothers passed away. Neither one of them had dementia as I understand.    I reviewed your office note from 04/25/2015, which you kindly included. Recent blood work from 02/21/2015 was reviewed as well. CBC with differential was normal, CMP showed no significant abnormality, TSH 2.13, vitamin D level within range.  She has had flareup in her depression and anxiety.  She had a neuropsychological evaluation with Dr. Valentina Shaggy on 07/14/2015, and I reviewed the report:    Memory loss R41.3 Unspecified depressive disorder F32.9   Recommendation Consider referral to neurology for further assessment.     Of note, she went to the emergency room on 07/05/2015 for chest pain. I reviewed the records. EKG and cardiac enzymes were unremarkable. She was describing a sharp chest pain.  Her Past Medical History Is Significant For: Past Medical History:  Diagnosis Date  . Anxiety   . CKD (chronic kidney disease), stage III   . GERD without esophagitis   . Hyperlipemia   . Major depressive disorder, single episode, unspecified   . Memory difficulty   . Thyroid disease   . Vitamin D deficiency     Her Past Surgical History Is Significant For: Past  Surgical History:  Procedure Laterality Date  . BREAST BIOPSY Left   . CATARACT EXTRACTION, BILATERAL    . GLAUCOMA SURGERY    . WRIST SURGERY Left     Her Family History Is Significant For: Family History  Problem Relation Age of Onset  . CAD Father   . CAD Brother   . CVA Paternal Grandmother     Her Social History Is Significant For: Social History   Social History  . Marital status: Single    Spouse name: N/A  . Number of children: 0  . Years of education: college    Occupational History  . Retired    Social History Main Topics  . Smoking status: Never Smoker  . Smokeless tobacco: Never Used  . Alcohol use No  . Drug use: No  . Sexual activity: Not Asked   Other Topics Concern  . None   Social History Narrative   Drinks 1 cup of coffee a day     Her Allergies Are:  No Known Allergies:   Her Current Medications Are:  Outpatient  Encounter Prescriptions as of 06/14/2016  Medication Sig  . Cholecalciferol (VITAMIN D3) 2000 units capsule Take by mouth.  . levothyroxine (SYNTHROID, LEVOTHROID) 75 MCG tablet Take 75 mcg by mouth daily before breakfast.  . pravastatin (PRAVACHOL) 40 MG tablet   . rivastigmine (EXELON) 1.5 MG capsule Take 1 capsule (1.5 mg total) by mouth 2 (two) times daily.  . sertraline (ZOLOFT) 50 MG tablet Take 50 mg by mouth daily.   No facility-administered encounter medications on file as of 06/14/2016.   :  Review of Systems:  Out of a complete 14 point review of systems, all are reviewed and negative with the exception of these symptoms as listed below:  Review of Systems  Neurological:       No new concerns per patient.  Sister asks to go over the results of EEG.     Objective:  Neurologic Exam  Physical Exam Physical Examination:   Vitals:   06/14/16 1020  BP: 130/68  Pulse: 70  Resp: 14    General Examination: The patient is a very pleasant 81 y.o. female in no acute distress. She appears well-developed and  well-nourished and well groomed. More conversant, good spirits.   HEENT: Normocephalic, atraumatic, pupils are equal, round and reactive to light and accommodation. Funduscopic exam is normal with sharp disc margins noted. Extraocular tracking is good without limitation to gaze excursion or nystagmus noted. Normal smooth pursuit is noted. Hearing is grossly intact. Tympanic membranes are clear bilaterally. Face is symmetric with normal facial animation and normal facial sensation. Speech is clear with no dysarthria noted. There is no hypophonia. There is no lip, neck/head, jaw or voice tremor. Neck is supple with full range of passive and active motion. There are no carotid bruits on auscultation. Oropharynx exam reveals: mild mouth dryness, adequate dental hygiene and mild airway crowding. Mallampati is class II. Tongue protrudes centrally and palate elevates symmetrically.   Chest: Clear to auscultation without wheezing, rhonchi or crackles noted.  Heart: S1+S2+0, regular and normal without murmurs, rubs or gallops noted.   Abdomen: Soft, non-tender and non-distended with normal bowel sounds appreciated on auscultation.  Extremities: There is 1+ pitting edema in the distal lower extremities bilaterally. Pedal pulses are intact.  Skin: Warm and dry without trophic changes noted.  Musculoskeletal: exam reveals no obvious joint deformities, tenderness or joint swelling or erythema.   Neurologically:  Mental status: The patient is awake, alert and paying good attention. Her immediate and remote memory, attention, language skills and fund of knowledge are impaired. Mood is normal and affect is normal.   On 08/08/2015: MMSE: 24/30, clock drawing 3 out of 4, animal fluency 6/m.  On 03/12/2016: MMSE: 19/30, CDT: 3/4, AFT: 6/min.  Cranial nerves II - XII are as described above under HEENT exam. In addition: shoulder shrug is normal with equal shoulder height noted. Motor exam: Normal bulk, strength  and tone is noted. There is no drift, tremor or rebound. Romberg is negative. Reflexes are 1+ throughout. Fine motor skills and coordination: globally mildly impaired.  Cerebellar testing: No dysmetria or intention tremor.  Sensory exam: intact to light touch in the upper and lower extremities.  Gait, station and balance: She stands up slowly, she walks cautiously, she has no cane or walking aid. Balance is mildly impaired, stable.  Assessment and Plan:    In summary, DIAN LAPRADE is a very pleasant 81 y.o.-year old female with An underlying medical history of vitamin D deficiency, hyperlipidemia, thyroid disease, reflux disease,  depression, anxiety, and chronic kidney disease, who presents for follow-up consultation of her mention without behavioral disturbance, workup in the form of brain MRI and neuropsychological testing was supportive of the diagnosis although neuropsychological testing gave no definitive diagnosis of dementia and concerns for memory loss and depressive disorder. Her sister moved in with the patient several months ago which has been helpful. Patient has not been driving. She had stopped taking her antidepressant medication but was encouraged to restart it. She reported side effects with donepezil but was vague about it. She could not afford Exelon patch and started Exelon generic capsules 1.5 mg strength last time. She had initially not filled it for unclear reasons. She has been able to take it and reports no side effects. I would like to increase the Exelon to 3 mg twice daily. In the interim, we did an EEG which showed nonspecific findings, normal results, she had no additional spells of decreased attentiveness or zoning out spells. Brain MRI had shown moderate generalized atrophy and mild white matter changes. Her memory scores have declined, we will recheck next time. I suggested a three-month checkup with one of our nurse practitioners and we will consider increasing the  Exelon to the next dose at the time. I provided a new prescription for Exelon generic 3 mg strength, sent to their retail pharmacy. I answered all their questions today and the patient and her sister were in agreement.  I spent 20 minutes in total face-to-face time with the patient, more than 50% of which was spent in counseling and coordination of care, reviewing test results, reviewing medication and discussing or reviewing the diagnosis of dementia, its prognosis and treatment options. Pertinent laboratory and imaging test results that were available during this visit with the patient were reviewed by me and considered in my medical decision making (see chart for details).

## 2016-06-14 NOTE — Patient Instructions (Signed)
We will increase your Exelon Capsule to 3mg  twice daily as you are tolerating the initial dose.  We will do a recheck in 3 months with one of our nurse practitioners, to see if you are still doing okay and can increase to the next dose of Exelon.

## 2016-06-20 ENCOUNTER — Encounter: Payer: Self-pay | Admitting: Podiatry

## 2016-06-20 ENCOUNTER — Ambulatory Visit (INDEPENDENT_AMBULATORY_CARE_PROVIDER_SITE_OTHER): Payer: Medicare HMO | Admitting: Podiatry

## 2016-06-20 DIAGNOSIS — B351 Tinea unguium: Secondary | ICD-10-CM

## 2016-06-20 DIAGNOSIS — M2041 Other hammer toe(s) (acquired), right foot: Secondary | ICD-10-CM

## 2016-06-20 DIAGNOSIS — M2042 Other hammer toe(s) (acquired), left foot: Secondary | ICD-10-CM

## 2016-06-20 DIAGNOSIS — M79671 Pain in right foot: Secondary | ICD-10-CM | POA: Diagnosis not present

## 2016-06-20 DIAGNOSIS — M79672 Pain in left foot: Secondary | ICD-10-CM | POA: Diagnosis not present

## 2016-06-20 NOTE — Patient Instructions (Signed)
Seen for hypertrophic nails. All nails debrided. Return in 3 months or as needed.  

## 2016-06-20 NOTE — Progress Notes (Signed)
SUBJECTIVE: 81 y.o. year old female presents requesting toe nails trimmed. Also have painful corn on 2nd toe right.  Patient is a hard of hearing.   OBJECTIVE: DERMATOLOGIC EXAMINATION: Thick dystrophic nails x 10. Painful corn 2nd toe right, pinch callus under the first MPJ bilateral.  VASCULAR EXAMINATION OF LOWER LIMBS: All pedal pulses are palpable with normal pulsation.   NEUROLOGIC EXAMINATION OF THE LOWER LIMBS: All epicritic and tactile sensations grossly intact.   MUSCULOSKELETAL EXAMINATION: Positive for severe Valgus deformity of both great toe sitting under the 2nd toe bilateral. Medially deviated 2nd toe with dorsal digital corn over PIPJ due to under riding hallux.   ASSESSMENT: Subluxed 1st and 2nd MPJ bilateral. Severe HAV with bunion deformity bilateral. Severe Hammer toe deformity with digital corns. Mycotic nails x 10. Pain in both feet.  PLAN: Reviewed clinical findings and available treatment options. Advised palliative care for deformed 2nd toe due to severity and her age.  All lesions debrided.  All nails debrided. Return in 3 moths.

## 2016-07-09 ENCOUNTER — Ambulatory Visit: Payer: Commercial Managed Care - HMO | Admitting: Nurse Practitioner

## 2016-08-10 ENCOUNTER — Observation Stay (HOSPITAL_BASED_OUTPATIENT_CLINIC_OR_DEPARTMENT_OTHER)
Admission: EM | Admit: 2016-08-10 | Discharge: 2016-08-12 | Disposition: A | Discharge status: Home / Self Care | Payer: Medicare HMO | Attending: Internal Medicine | Admitting: Internal Medicine

## 2016-08-10 ENCOUNTER — Encounter (HOSPITAL_BASED_OUTPATIENT_CLINIC_OR_DEPARTMENT_OTHER): Payer: Self-pay | Admitting: *Deleted

## 2016-08-10 ENCOUNTER — Emergency Department (HOSPITAL_BASED_OUTPATIENT_CLINIC_OR_DEPARTMENT_OTHER): Payer: Medicare HMO

## 2016-08-10 DIAGNOSIS — R05 Cough: Secondary | ICD-10-CM | POA: Diagnosis not present

## 2016-08-10 DIAGNOSIS — N183 Chronic kidney disease, stage 3 (moderate): Secondary | ICD-10-CM | POA: Insufficient documentation

## 2016-08-10 DIAGNOSIS — E871 Hypo-osmolality and hyponatremia: Secondary | ICD-10-CM | POA: Diagnosis not present

## 2016-08-10 DIAGNOSIS — F0390 Unspecified dementia without behavioral disturbance: Secondary | ICD-10-CM | POA: Insufficient documentation

## 2016-08-10 DIAGNOSIS — R109 Unspecified abdominal pain: Secondary | ICD-10-CM | POA: Diagnosis not present

## 2016-08-10 DIAGNOSIS — Z79899 Other long term (current) drug therapy: Secondary | ICD-10-CM | POA: Insufficient documentation

## 2016-08-10 DIAGNOSIS — R938 Abnormal findings on diagnostic imaging of other specified body structures: Secondary | ICD-10-CM | POA: Insufficient documentation

## 2016-08-10 DIAGNOSIS — N390 Urinary tract infection, site not specified: Secondary | ICD-10-CM | POA: Diagnosis not present

## 2016-08-10 DIAGNOSIS — R160 Hepatomegaly, not elsewhere classified: Secondary | ICD-10-CM | POA: Diagnosis not present

## 2016-08-10 DIAGNOSIS — E039 Hypothyroidism, unspecified: Secondary | ICD-10-CM | POA: Insufficient documentation

## 2016-08-10 DIAGNOSIS — R112 Nausea with vomiting, unspecified: Secondary | ICD-10-CM | POA: Diagnosis not present

## 2016-08-10 DIAGNOSIS — R9389 Abnormal findings on diagnostic imaging of other specified body structures: Secondary | ICD-10-CM

## 2016-08-10 DIAGNOSIS — E860 Dehydration: Secondary | ICD-10-CM | POA: Insufficient documentation

## 2016-08-10 LAB — COMPREHENSIVE METABOLIC PANEL
ALK PHOS: 98 U/L (ref 38–126)
ALT: 10 U/L — AB (ref 14–54)
ANION GAP: 11 (ref 5–15)
AST: 21 U/L (ref 15–41)
Albumin: 3.8 g/dL (ref 3.5–5.0)
BILIRUBIN TOTAL: 0.4 mg/dL (ref 0.3–1.2)
BUN: 10 mg/dL (ref 6–20)
CALCIUM: 8.9 mg/dL (ref 8.9–10.3)
CO2: 22 mmol/L (ref 22–32)
CREATININE: 0.99 mg/dL (ref 0.44–1.00)
Chloride: 95 mmol/L — ABNORMAL LOW (ref 101–111)
GFR calc non Af Amer: 53 mL/min — ABNORMAL LOW (ref >60)
Glucose, Bld: 112 mg/dL — ABNORMAL HIGH (ref 65–99)
Potassium: 3.7 mmol/L (ref 3.5–5.1)
Sodium: 128 mmol/L — ABNORMAL LOW (ref 135–145)
TOTAL PROTEIN: 7.3 g/dL (ref 6.5–8.1)

## 2016-08-10 LAB — LIPASE, BLOOD: Lipase: 121 U/L — ABNORMAL HIGH (ref 11–51)

## 2016-08-10 LAB — CBC WITH DIFFERENTIAL/PLATELET
BASOS PCT: 0 %
Basophils Absolute: 0 10*3/uL (ref 0.0–0.1)
EOS ABS: 0.1 10*3/uL (ref 0.0–0.7)
Eosinophils Relative: 2 %
HCT: 34.8 % — ABNORMAL LOW (ref 36.0–46.0)
HEMOGLOBIN: 12.4 g/dL (ref 12.0–15.0)
Lymphocytes Relative: 19 %
Lymphs Abs: 1 10*3/uL (ref 0.7–4.0)
MCH: 31 pg (ref 26.0–34.0)
MCHC: 35.6 g/dL (ref 30.0–36.0)
MCV: 87 fL (ref 78.0–100.0)
Monocytes Absolute: 0.5 10*3/uL (ref 0.1–1.0)
Monocytes Relative: 10 %
NEUTROS PCT: 69 %
Neutro Abs: 3.6 10*3/uL (ref 1.7–7.7)
PLATELETS: 266 10*3/uL (ref 150–400)
RBC: 4 MIL/uL (ref 3.87–5.11)
RDW: 11.8 % (ref 11.5–15.5)
WBC: 5.3 10*3/uL (ref 4.0–10.5)

## 2016-08-10 LAB — URINALYSIS, ROUTINE W REFLEX MICROSCOPIC
Bilirubin Urine: NEGATIVE
Glucose, UA: NEGATIVE mg/dL
KETONES UR: 15 mg/dL — AB
Nitrite: POSITIVE — AB
PROTEIN: NEGATIVE mg/dL
SPECIFIC GRAVITY, URINE: 1.014 (ref 1.005–1.030)
pH: 6 (ref 5.0–8.0)

## 2016-08-10 LAB — URINALYSIS, MICROSCOPIC (REFLEX)

## 2016-08-10 LAB — TROPONIN I

## 2016-08-10 MED ORDER — IOPAMIDOL (ISOVUE-300) INJECTION 61%
100.0000 mL | Freq: Once | INTRAVENOUS | Status: AC | PRN
  Administered 2016-08-10: 100 mL via INTRAVENOUS

## 2016-08-10 MED ORDER — ONDANSETRON HCL 4 MG/2ML IJ SOLN
4.0000 mg | Freq: Once | INTRAMUSCULAR | Status: AC
  Administered 2016-08-10: 4 mg via INTRAVENOUS
  Filled 2016-08-10: qty 2

## 2016-08-10 MED ORDER — SODIUM CHLORIDE 0.9 % IV BOLUS (SEPSIS)
500.0000 mL | Freq: Once | INTRAVENOUS | Status: AC
  Administered 2016-08-10: 500 mL via INTRAVENOUS

## 2016-08-10 MED ORDER — DEXTROSE 5 % IV SOLN
1.0000 g | Freq: Once | INTRAVENOUS | Status: AC
  Administered 2016-08-10: 1 g via INTRAVENOUS
  Filled 2016-08-10: qty 10

## 2016-08-10 NOTE — ED Triage Notes (Signed)
States she has had abdominal pain for a long time but is cannot give me a time frame. Sister states she has been vomiting today.

## 2016-08-10 NOTE — ED Provider Notes (Signed)
[facility] Provider Note   CSN: [id number] Arrival date & time: 08/10/16  1741 By signing my name below, I, Gaspar Cola, attest that this documentation has been prepared under the direction and in the presence of Davonna Belling, MD . Electronically Signed: Gaspar Cola Scribe. 08/10/2016. 5:54 PM  History   Chief Complaint Chief Complaint  Patient presents with  . Abdominal Pain  . Emesis   LEVEL 5 CAVEAT: potential dementia   HPI Jessica [name] is a 81 y.o. female who presents to the Emergency Department complaining of intermittent abdominal pain for a few weeks. Pt and friend note associated productive coughing, nausea, decreased appetite, and weight loss with her current symptoms as well. Pt denies vomiting, fever and hemoptysis.   The history is provided by the patient and a friend. History limited by: possible dementia. No language interpreter was used.    Past Medical History:  Diagnosis Date  . Anxiety   . CKD (chronic kidney disease), stage III   . GERD without esophagitis   . Hyperlipemia   . Major depressive disorder, single episode, unspecified   . Memory difficulty   . Thyroid disease   . Vitamin D deficiency     Patient Active Problem List   Diagnosis Date Noted  . Nausea and vomiting 08/10/2016    Past Surgical History:  Procedure Laterality Date  . BREAST BIOPSY Left   . CATARACT EXTRACTION, BILATERAL    . GLAUCOMA SURGERY    . WRIST SURGERY Left     OB History    No data available       Home Medications    Prior to Admission medications   Medication Sig Start Date End Date Taking? Authorizing Provider  Cholecalciferol (VITAMIN D3) 2000 units capsule Take by mouth.    Historical Provider, MD  levothyroxine (SYNTHROID, LEVOTHROID) 75 MCG tablet Take 75 mcg by mouth daily before breakfast.    Historical Provider, MD  pravastatin (PRAVACHOL) 40 MG tablet  05/11/16   Historical Provider, MD  rivastigmine (EXELON) 3 MG capsule  Take 1 capsule (3 mg total) by mouth 2 (two) times daily. 06/14/16   Star Age, MD  sertraline (ZOLOFT) 50 MG tablet Take 50 mg by mouth daily. 02/24/16   Historical Provider, MD    Family History Family History  Problem Relation Age of Onset  . CAD Father   . CAD Brother   . CVA Paternal Grandmother     Social History Social History  Substance Use Topics  . Smoking status: Never Smoker  . Smokeless tobacco: Never Used  . Alcohol use No     Allergies   Patient has no known allergies.  Review of Systems Review of Systems  Unable to perform ROS: Dementia   Physical Exam Updated Vital Signs BP (!) 122/53 (BP Location: Left Arm)   Pulse 69   Temp 98.3 F (36.8 C) (Oral)   Resp 16   Wt 126 lb 6 oz (57.3 kg)   SpO2 98%   BMI 24.68 kg/m   Physical Exam  Constitutional: She appears well-developed and well-nourished. No distress.  Awake, appropriate. Defers to friend for most answers during exam.   HENT:  Head: Normocephalic and atraumatic.  Neck: Neck supple.  Cardiovascular: Normal rate and regular rhythm.  Exam reveals no gallop and no friction rub.   No murmur heard. Pulmonary/Chest: Effort normal. No respiratory distress. She has rales.  Rales in upper right lung. Lower lung fields clear.  Abdominal: Soft. There  is tenderness. There is no rebound and no guarding.  Slight tenderness in RUQ without rebound or guarding.   Musculoskeletal: She exhibits no edema.  No peripheral edema.   Neurological: She is alert.  Skin: Skin is warm and dry.  Psychiatric: She has a normal mood and affect.  Nursing note and vitals reviewed.    ED Treatments / Results  COORDINATION OF CARE:  5:59 PM Discussed treatment plan with pt and friend at bedside and they agreed to plan.  Labs (all labs ordered are listed, but only abnormal results are displayed) Labs Reviewed  COMPREHENSIVE METABOLIC PANEL - Abnormal; Notable for the following:       Result Value   Sodium 128 (*)      Chloride 95 (*)    Glucose, Bld 112 (*)    ALT 10 (*)    GFR calc non Af Amer 53 (*)    All other components within normal limits  LIPASE, BLOOD - Abnormal; Notable for the following:    Lipase 121 (*)    All other components within normal limits  CBC WITH DIFFERENTIAL/PLATELET - Abnormal; Notable for the following:    HCT 34.8 (*)    All other components within normal limits  URINALYSIS, ROUTINE W REFLEX MICROSCOPIC - Abnormal; Notable for the following:    APPearance CLOUDY (*)    Hgb urine dipstick TRACE (*)    Ketones, ur 15 (*)    Nitrite POSITIVE (*)    Leukocytes, UA LARGE (*)    All other components within normal limits  URINALYSIS, MICROSCOPIC (REFLEX) - Abnormal; Notable for the following:    Bacteria, UA MANY (*)    Squamous Epithelial / LPF 0-5 (*)    All other components within normal limits  URINE CULTURE  TROPONIN I    EKG  EKG Interpretation  Date/Time:  Friday Aug 10 2016 18:54:41 EDT Ventricular Rate:  66 PR Interval:    QRS Duration: 96 QT Interval:  415 QTC Calculation: 435 R Axis:   55 Text Interpretation:  Sinus rhythm Confirmed by Alvino Chapel  MD, Ovid Curd [phone]) on 08/10/2016 7:16:13 PM       Radiology Dg Chest 2 View  Result Date: 08/10/2016 CLINICAL DATA:  81 year old female with lightheadedness, cough and weakness for the past several weeks. No known fever. EXAM: CHEST  2 VIEW COMPARISON:  Chest x-ray 07/05/2015. FINDINGS: Lung volumes are normal. No consolidative airspace disease. No pleural effusions. No pneumothorax. Large hiatal hernia. No pulmonary nodule or mass noted. Pulmonary vasculature and the cardiomediastinal silhouette are otherwise within normal limits. Aortic atherosclerosis. IMPRESSION: 1. No radiographic evidence of acute cardiopulmonary disease. 2. Aortic atherosclerosis. 3. Large hiatal hernia. Electronically Signed   By: Vinnie Langton M.D.   On: 08/10/2016 18:57   Ct Abdomen Pelvis W Contrast  Result Date:  08/10/2016 CLINICAL DATA:  81 y/o F; upper abdominal pain, cough, nausea, weight loss. History of COPD, HLD, GERD. EXAM: CT ABDOMEN AND PELVIS WITH CONTRAST TECHNIQUE: Multidetector CT imaging of the abdomen and pelvis was performed using the standard protocol following bolus administration of intravenous contrast. CONTRAST:  136mL ISOVUE-300 IOPAMIDOL (ISOVUE-300) INJECTION 61% COMPARISON:  None. FINDINGS: Lower chest: Moderate size hiatal hernia. Hepatobiliary: Multiple fluid attenuating foci well-circumscribed scattered throughout the liver compatible with cysts. The largest cyst is in the dome of the liver and measures up to 3.8 cm. Soft tissue mass at the posterior margin of segment 7 of the liver measuring 18 x 32 x 17 mm (AP x  ML x CC series 2, image 24 and series 6, image 16). Pancreas: Unremarkable. No pancreatic ductal dilatation or surrounding inflammatory changes. Spleen: Normal in size without focal abnormality. Adrenals/Urinary Tract: Adrenal glands are unremarkable. Multiple parapelvic cysts within the hilum of the kidney bilaterally. Kidneys are otherwise normal, without renal calculi, focal lesion, or hydronephrosis. Bladder is unremarkable. Stomach/Bowel: Stomach is within normal limits. Appendix appears normal. No evidence of bowel wall thickening, distention, or inflammatory changes. Scattered sigmoid and descending colon diverticulosis. Vascular/Lymphatic: Aortic atherosclerosis with moderate to severe calcification. No enlarged abdominal or pelvic lymph nodes. Reproductive: Thickening of the endometrium to 10 mm (series 6, image 44). No adnexal mass identified. Other: No abdominal wall hernia or abnormality. No abdominopelvic ascites. Musculoskeletal: No acute or significant osseous findings. IMPRESSION: 1. No acute process identified as explanation for abdominal pain. 2. Mass within segment 7 of the liver measuring up to 3.2 cm may be metastatic or of primary hepatic/biliary origin. Possible  multiple cystic lobulations suggests biliary cystadenoma/cystadenocarcinoma. Further characterization with liver MRI with and without contrast is recommended. This recommendation follows ACR consensus guidelines: Management of Incidental Liver Lesions on CT: A White Paper of the ACR Incidental Findings Committee. J Am Coll Radiol 2017; 67:3419-3790. 3. Thickened endometrium may represent an endometrial neoplasm or hyperplasia. Given patient's age further characterization with pelvic ultrasound is recommended. 4. Moderate-sized hiatal hernia containing a portion of the stomach. 5. Sigmoid diverticulosis. 6. Aortic atherosclerosis with calcifications. Electronically Signed   By: Kristine Garbe M.D.   On: 08/10/2016 21:23    Procedures Procedures (including critical care time)  Medications Ordered in ED Medications  sodium chloride 0.9 % bolus 500 mL (0 mLs Intravenous Stopped 08/10/16 1915)  ondansetron (ZOFRAN) injection 4 mg (4 mg Intravenous Given 08/10/16 1822)  iopamidol (ISOVUE-300) 61 % injection 100 mL (100 mLs Intravenous Contrast Given 08/10/16 2048)  cefTRIAXone (ROCEPHIN) 1 g in dextrose 5 % 50 mL IVPB (1 g Intravenous New Bag/Given 08/10/16 2303)    Initial Impression / Assessment and Plan / ED Course  I have reviewed the triage vital signs and the nursing notes.  Pertinent labs & imaging results that were available during my care of the patient were reviewed by me and considered in my medical decision making (see chart for details).     Patient with abdominal pain and vomiting. Mild hyponatremia. History is somewhat difficult to get due to dementia. Does have both UTI and an abnormality in her liver. Will admit to hospitalist. Discussed with Dr. Alcario Drought.  Final Clinical Impressions(s) / ED Diagnoses   Final diagnoses:  Lower urinary tract infectious disease  Hyponatremia  Liver mass   New Prescriptions New Prescriptions   No medications on file  I personally performed  the services described in this documentation, which was scribed in my presence. The recorded information has been reviewed and is accurate.       Davonna Belling, MD 08/10/16 [phone]

## 2016-08-10 NOTE — Plan of Care (Signed)
Patient with abdominal pain.  Has UTI, but EDP also got CT abd pelvis that shows mass of of liver.  Looks cystic.  Needs MRI of liver.  Patient coming to med surg obs if family wants to pursue further work up on this patient who has dementia.

## 2016-08-11 ENCOUNTER — Observation Stay (HOSPITAL_COMMUNITY): Payer: Medicare HMO

## 2016-08-11 DIAGNOSIS — R112 Nausea with vomiting, unspecified: Secondary | ICD-10-CM

## 2016-08-11 DIAGNOSIS — C801 Malignant (primary) neoplasm, unspecified: Secondary | ICD-10-CM | POA: Diagnosis not present

## 2016-08-11 DIAGNOSIS — E871 Hypo-osmolality and hyponatremia: Secondary | ICD-10-CM | POA: Diagnosis present

## 2016-08-11 DIAGNOSIS — R938 Abnormal findings on diagnostic imaging of other specified body structures: Secondary | ICD-10-CM

## 2016-08-11 DIAGNOSIS — N390 Urinary tract infection, site not specified: Secondary | ICD-10-CM

## 2016-08-11 DIAGNOSIS — K7581 Nonalcoholic steatohepatitis (NASH): Secondary | ICD-10-CM | POA: Diagnosis not present

## 2016-08-11 DIAGNOSIS — N8500 Endometrial hyperplasia, unspecified: Secondary | ICD-10-CM | POA: Diagnosis not present

## 2016-08-11 DIAGNOSIS — R1084 Generalized abdominal pain: Secondary | ICD-10-CM | POA: Diagnosis not present

## 2016-08-11 DIAGNOSIS — R110 Nausea: Secondary | ICD-10-CM | POA: Diagnosis not present

## 2016-08-11 DIAGNOSIS — R160 Hepatomegaly, not elsewhere classified: Secondary | ICD-10-CM | POA: Diagnosis present

## 2016-08-11 LAB — BASIC METABOLIC PANEL
Anion gap: 8 (ref 5–15)
BUN: 6 mg/dL (ref 6–20)
CHLORIDE: 102 mmol/L (ref 101–111)
CO2: 25 mmol/L (ref 22–32)
Calcium: 8.3 mg/dL — ABNORMAL LOW (ref 8.9–10.3)
Creatinine, Ser: 0.9 mg/dL (ref 0.44–1.00)
GFR calc Af Amer: >60 mL/min (ref >60)
GFR calc non Af Amer: 60 mL/min — ABNORMAL LOW (ref >60)
GLUCOSE: 92 mg/dL (ref 65–99)
POTASSIUM: 3.4 mmol/L — AB (ref 3.5–5.1)
Sodium: 135 mmol/L (ref 135–145)

## 2016-08-11 MED ORDER — ENSURE ENLIVE PO LIQD
237.0000 mL | Freq: Two times a day (BID) | ORAL | Status: DC
  Administered 2016-08-11 – 2016-08-12 (×3): 237 mL via ORAL
  Filled 2016-08-11 (×4): qty 237

## 2016-08-11 MED ORDER — ENOXAPARIN SODIUM 40 MG/0.4ML ~~LOC~~ SOLN
40.0000 mg | SUBCUTANEOUS | Status: DC
  Administered 2016-08-11 – 2016-08-12 (×2): 40 mg via SUBCUTANEOUS
  Filled 2016-08-11 (×2): qty 0.4

## 2016-08-11 MED ORDER — SODIUM CHLORIDE 0.9 % IV SOLN
INTRAVENOUS | Status: DC
  Administered 2016-08-11 (×3): via INTRAVENOUS

## 2016-08-11 MED ORDER — DEXTROSE 5 % IV SOLN
1.0000 g | INTRAVENOUS | Status: DC
  Administered 2016-08-11: 1 g via INTRAVENOUS
  Filled 2016-08-11 (×2): qty 10

## 2016-08-11 MED ORDER — SERTRALINE HCL 50 MG PO TABS
50.0000 mg | ORAL_TABLET | Freq: Every day | ORAL | Status: DC
  Administered 2016-08-11 – 2016-08-12 (×2): 50 mg via ORAL
  Filled 2016-08-11 (×2): qty 1

## 2016-08-11 MED ORDER — ONDANSETRON HCL 4 MG/2ML IJ SOLN: 4.0000 mg | Freq: Four times a day (QID) | INTRAMUSCULAR | Status: DC | PRN

## 2016-08-11 MED ORDER — LEVOTHYROXINE SODIUM 75 MCG PO TABS
75.0000 ug | ORAL_TABLET | Freq: Every day | ORAL | Status: DC
  Administered 2016-08-11 – 2016-08-12 (×2): 75 ug via ORAL
  Filled 2016-08-11 (×2): qty 1

## 2016-08-11 MED ORDER — RIVASTIGMINE TARTRATE 1.5 MG PO CAPS
3.0000 mg | ORAL_CAPSULE | Freq: Two times a day (BID) | ORAL | Status: DC
Start: 2016-08-11 — End: 2016-08-12
  Administered 2016-08-11 – 2016-08-12 (×3): 3 mg via ORAL
  Filled 2016-08-11 (×3): qty 2

## 2016-08-11 MED ORDER — POTASSIUM CHLORIDE CRYS ER 20 MEQ PO TBCR
40.0000 meq | EXTENDED_RELEASE_TABLET | ORAL | Status: AC
  Administered 2016-08-11 (×2): 40 meq via ORAL
  Filled 2016-08-11 (×2): qty 2

## 2016-08-11 NOTE — H&P (Signed)
History and Physical    Jessica [name] JSE:[id number] DOB: 1935-05-10 DOA: 08/10/2016  PCP: Harlan Stains, MD  Patient coming from: Home  I have personally briefly reviewed patient's old medical records in [place]  Chief Complaint: Nausea  HPI: Jessica [name] is a 81 y.o. female with medical history significant of likely early dementia.  Patient presents to the ED at [facility] with c/o Nausea.  Patient has had intermittent abd pain for the past few weeks, nausea, decreased appetite and weight loss.  Patient denies vomiting, fever.  Symptoms are persistent, nothing makes them better or worse.  History is somewhat limited by patients early dementia.   ED Course: Patient has UTI on UA.  Patient with mild hyponatremia.  Started on rocephin and given 500 cc IVF bolus.  Patient also got CT scan of abdomen which demonstrates a liver mass and thickened endometrium.  She was transferred to [facility] for further management of UTI and work up.   Review of Systems: As per HPI otherwise 10 point review of systems negative.   Past Medical History:  Diagnosis Date  . Anxiety   . CKD (chronic kidney disease), stage III   . GERD without esophagitis   . Hyperlipemia   . Major depressive disorder, single episode, unspecified   . Memory difficulty   . Thyroid disease   . Vitamin D deficiency     Past Surgical History:  Procedure Laterality Date  . BREAST BIOPSY Left   . CATARACT EXTRACTION, BILATERAL    . GLAUCOMA SURGERY    . WRIST SURGERY Left      reports that she has never smoked. She has never used smokeless tobacco. She reports that she does not drink alcohol or use drugs.  No Known Allergies  Family History  Problem Relation Age of Onset  . CAD Father   . CAD Brother   . CVA Paternal Grandmother      Prior to Admission medications   Medication Sig Start Date End Date Taking? Authorizing Provider  Cholecalciferol (VITAMIN D3) 2000 units capsule Take 2,000 Units by mouth  daily.    Yes [provider]  levothyroxine (SYNTHROID, LEVOTHROID) 75 MCG tablet Take 75 mcg by mouth daily before breakfast.   Yes [provider]  pravastatin (PRAVACHOL) 40 MG tablet Take 40 mg by mouth daily.  05/11/16  Yes [provider]  rivastigmine (EXELON) 3 MG capsule Take 1 capsule (3 mg total) by mouth 2 (two) times daily. 06/14/16  Yes Star Age, MD  sertraline (ZOLOFT) 50 MG tablet Take 50 mg by mouth daily. 02/24/16  Yes [provider]    Physical Exam: Vitals:   08/10/16 1756 08/10/16 2010 08/10/16 2326 08/11/16 0148  BP: (!) 159/82 139/71 (!) 122/53 (!) 142/70  Pulse: 74 69 69 62  Resp: 20 19 16 20   Temp: 98.3 F (36.8 C)   97.4 F (36.3 C)  TempSrc: Oral   Oral  SpO2: 98% 99% 98% 100%  Weight: 57.3 kg (126 lb 6 oz)       Constitutional: NAD, calm, comfortable Eyes: PERRL, lids and conjunctivae normal ENMT: Mucous membranes are moist. Posterior pharynx clear of any exudate or lesions.Normal dentition.  Neck: normal, supple, no masses, no thyromegaly Respiratory: clear to auscultation bilaterally, no wheezing, no crackles. Normal respiratory effort. No accessory muscle use.  Cardiovascular: Regular rate and rhythm, no murmurs / rubs / gallops. No extremity edema. 2+ pedal pulses. No carotid bruits.  Abdomen: no tenderness, no  masses palpated. No hepatosplenomegaly. Bowel sounds positive.  Musculoskeletal: no clubbing / cyanosis. No joint deformity upper and lower extremities. Good ROM, no contractures. Normal muscle tone.  Skin: no rashes, lesions, ulcers. No induration Neurologic: CN 2-12 grossly intact. Sensation intact, DTR normal. Strength 5/5 in all 4.  Psychiatric: Defers to family member for most answers during exam.   Labs on Admission: I have personally reviewed following labs and imaging studies  CBC:  Recent Labs Lab 08/10/16 1813  WBC 5.3  NEUTROABS 3.6  HGB 12.4  HCT 34.8*  MCV 87.0  PLT 629   Basic  Metabolic Panel:  Recent Labs Lab 08/10/16 1813  NA 128*  K 3.7  CL 95*  CO2 22  GLUCOSE 112*  BUN 10  CREATININE 0.99  CALCIUM 8.9   GFR: Estimated Creatinine Clearance: 37.1 mL/min (by C-G formula based on SCr of 0.99 mg/dL). Liver Function Tests:  Recent Labs Lab 08/10/16 1813  AST 21  ALT 10*  ALKPHOS 98  BILITOT 0.4  PROT 7.3  ALBUMIN 3.8    Recent Labs Lab 08/10/16 1813  LIPASE 121*   No results for input(s): AMMONIA in the last 168 hours. Coagulation Profile: No results for input(s): INR, PROTIME in the last 168 hours. Cardiac Enzymes:  Recent Labs Lab 08/10/16 1813  TROPONINI <0.03   BNP (last 3 results) No results for input(s): PROBNP in the last 8760 hours. HbA1C: No results for input(s): HGBA1C in the last 72 hours. CBG: No results for input(s): GLUCAP in the last 168 hours. Lipid Profile: No results for input(s): CHOL, HDL, LDLCALC, TRIG, CHOLHDL, LDLDIRECT in the last 72 hours. Thyroid Function Tests: No results for input(s): TSH, T4TOTAL, FREET4, T3FREE, THYROIDAB in the last 72 hours. Anemia Panel: No results for input(s): VITAMINB12, FOLATE, FERRITIN, TIBC, IRON, RETICCTPCT in the last 72 hours. Urine analysis:    Component Value Date/Time   COLORURINE YELLOW 08/10/2016 1813   APPEARANCEUR CLOUDY (A) 08/10/2016 1813   LABSPEC 1.014 08/10/2016 1813   PHURINE 6.0 08/10/2016 1813   GLUCOSEU NEGATIVE 08/10/2016 1813   HGBUR TRACE (A) 08/10/2016 1813   BILIRUBINUR NEGATIVE 08/10/2016 1813   KETONESUR 15 (A) 08/10/2016 1813   PROTEINUR NEGATIVE 08/10/2016 1813   NITRITE POSITIVE (A) 08/10/2016 1813   LEUKOCYTESUR LARGE (A) 08/10/2016 1813    Radiological Exams on Admission: Dg Chest 2 View  Result Date: 08/10/2016 CLINICAL DATA:  81 year old female with lightheadedness, cough and weakness for the past several weeks. No known fever. EXAM: CHEST  2 VIEW COMPARISON:  Chest x-ray 07/05/2015. FINDINGS: Lung volumes are normal. No  consolidative airspace disease. No pleural effusions. No pneumothorax. Large hiatal hernia. No pulmonary nodule or mass noted. Pulmonary vasculature and the cardiomediastinal silhouette are otherwise within normal limits. Aortic atherosclerosis. IMPRESSION: 1. No radiographic evidence of acute cardiopulmonary disease. 2. Aortic atherosclerosis. 3. Large hiatal hernia. Electronically Signed   By: Vinnie Langton M.D.   On: 08/10/2016 18:57   Ct Abdomen Pelvis W Contrast  Result Date: 08/10/2016 CLINICAL DATA:  81 y/o F; upper abdominal pain, cough, nausea, weight loss. History of COPD, HLD, GERD. EXAM: CT ABDOMEN AND PELVIS WITH CONTRAST TECHNIQUE: Multidetector CT imaging of the abdomen and pelvis was performed using the standard protocol following bolus administration of intravenous contrast. CONTRAST:  157mL ISOVUE-300 IOPAMIDOL (ISOVUE-300) INJECTION 61% COMPARISON:  None. FINDINGS: Lower chest: Moderate size hiatal hernia. Hepatobiliary: Multiple fluid attenuating foci well-circumscribed scattered throughout the liver compatible with cysts. The largest cyst is in the dome  of the liver and measures up to 3.8 cm. Soft tissue mass at the posterior margin of segment 7 of the liver measuring 18 x 32 x 17 mm (AP x ML x CC series 2, image 24 and series 6, image 16). Pancreas: Unremarkable. No pancreatic ductal dilatation or surrounding inflammatory changes. Spleen: Normal in size without focal abnormality. Adrenals/Urinary Tract: Adrenal glands are unremarkable. Multiple parapelvic cysts within the hilum of the kidney bilaterally. Kidneys are otherwise normal, without renal calculi, focal lesion, or hydronephrosis. Bladder is unremarkable. Stomach/Bowel: Stomach is within normal limits. Appendix appears normal. No evidence of bowel wall thickening, distention, or inflammatory changes. Scattered sigmoid and descending colon diverticulosis. Vascular/Lymphatic: Aortic atherosclerosis with moderate to severe  calcification. No enlarged abdominal or pelvic lymph nodes. Reproductive: Thickening of the endometrium to 10 mm (series 6, image 44). No adnexal mass identified. Other: No abdominal wall hernia or abnormality. No abdominopelvic ascites. Musculoskeletal: No acute or significant osseous findings. IMPRESSION: 1. No acute process identified as explanation for abdominal pain. 2. Mass within segment 7 of the liver measuring up to 3.2 cm may be metastatic or of primary hepatic/biliary origin. Possible multiple cystic lobulations suggests biliary cystadenoma/cystadenocarcinoma. Further characterization with liver MRI with and without contrast is recommended. This recommendation follows ACR consensus guidelines: Management of Incidental Liver Lesions on CT: A White Paper of the ACR Incidental Findings Committee. J Am Coll Radiol 2017; 99:3716-9678. 3. Thickened endometrium may represent an endometrial neoplasm or hyperplasia. Given patient's age further characterization with pelvic ultrasound is recommended. 4. Moderate-sized hiatal hernia containing a portion of the stomach. 5. Sigmoid diverticulosis. 6. Aortic atherosclerosis with calcifications. Electronically Signed   By: Kristine Garbe M.D.   On: 08/10/2016 21:23    EKG: Independently reviewed.  Assessment/Plan Principal Problem:   Nausea and vomiting Active Problems:   Acute lower UTI   Hyponatremia   Liver mass    1. N/V - likely secondary to the acute UTI most probably 1. Zofran PRN 2. Acute UTI - 1. Rocephin 2. Cultures pending 3. Hyponatremia - likely secondary to N/V 1. IVF with NS 2. Repeat BMP in AM 4. Liver mass - MRI liver with and without contrast ordered as per Radiology recs 5. Endometrial thickening on CT - pelvic US ordered as per radiology recs  DVT prophylaxis: Lovenox Code Status: Full Family Communication: Family at bedside Disposition Plan: Home after admit Consults called: None Admission status: Place in  obs   Trestin Vences, [place] [facility] Pager [phone]  If 7AM-7PM, please contact day team taking care of patient [email or web address] Password [email or web address]  08/11/2016, 2:12 AM

## 2016-08-11 NOTE — Progress Notes (Signed)
MRI tech on unit to transfer patient and ask if patient has been NPO. Patient  just finished her  dinner so test will be delayed until 08/12/16.   Writer was not told about the need for patient  to be NPO when she spoke to [place] in MRI earlier that morning.  Per MRI they will be unable to complete this test  today and it will need to be reordered stat to be completed tomorrow morning. Please call radiology in morning 0600 to let them know of stat MRI.

## 2016-08-11 NOTE — Progress Notes (Signed)
Patient seen and examined. Admitted after midnight secondary to general malaise, increase in her confusion and nausea/vomiting. Found to have UTI. Patient with underlying dementia. Incidentally also discovered to have liver mass and thickening of her endometrium. Per family no vaginal bleeding reported. Patient was dehydrated and with hyponatremia on admission lab-work. Feeling better, pleasantly confused, but able to follow commands. hemodynamically stable. Please refer to H&P written by Dr. Alcario Drought for further info/details on admission.   Plan: -continue IVF's and current antibiotics -will follow culture -will replete electrolytes and follow BMET in am -will follow MRI of liver and pelvic US as recommended by Radiology in order to characterized more the abnormal findings and determined if further work up is needed. -continue supportive care and follow clinical response.  Jessica [name] [phone]

## 2016-08-12 DIAGNOSIS — R112 Nausea with vomiting, unspecified: Secondary | ICD-10-CM | POA: Diagnosis not present

## 2016-08-12 DIAGNOSIS — E039 Hypothyroidism, unspecified: Secondary | ICD-10-CM

## 2016-08-12 DIAGNOSIS — N390 Urinary tract infection, site not specified: Secondary | ICD-10-CM | POA: Diagnosis not present

## 2016-08-12 DIAGNOSIS — R9389 Abnormal findings on diagnostic imaging of other specified body structures: Secondary | ICD-10-CM

## 2016-08-12 DIAGNOSIS — E871 Hypo-osmolality and hyponatremia: Secondary | ICD-10-CM | POA: Diagnosis not present

## 2016-08-12 DIAGNOSIS — R160 Hepatomegaly, not elsewhere classified: Secondary | ICD-10-CM | POA: Diagnosis not present

## 2016-08-12 DIAGNOSIS — R938 Abnormal findings on diagnostic imaging of other specified body structures: Secondary | ICD-10-CM | POA: Diagnosis not present

## 2016-08-12 LAB — BASIC METABOLIC PANEL
ANION GAP: 4 — AB (ref 5–15)
BUN: 11 mg/dL (ref 6–20)
CALCIUM: 8 mg/dL — AB (ref 8.9–10.3)
CO2: 24 mmol/L (ref 22–32)
Chloride: 109 mmol/L (ref 101–111)
Creatinine, Ser: 0.86 mg/dL (ref 0.44–1.00)
GLUCOSE: 96 mg/dL (ref 65–99)
POTASSIUM: 4.4 mmol/L (ref 3.5–5.1)
Sodium: 137 mmol/L (ref 135–145)

## 2016-08-12 LAB — CBC
HEMATOCRIT: 31.4 % — AB (ref 36.0–46.0)
Hemoglobin: 10.8 g/dL — ABNORMAL LOW (ref 12.0–15.0)
MCH: 30.8 pg (ref 26.0–34.0)
MCHC: 34.4 g/dL (ref 30.0–36.0)
MCV: 89.5 fL (ref 78.0–100.0)
PLATELETS: 249 10*3/uL (ref 150–400)
RBC: 3.51 MIL/uL — AB (ref 3.87–5.11)
RDW: 12.7 % (ref 11.5–15.5)
WBC: 5.5 10*3/uL (ref 4.0–10.5)

## 2016-08-12 MED ORDER — CEFUROXIME AXETIL 500 MG PO TABS: 500.0000 mg | ORAL_TABLET | Freq: Two times a day (BID) | ORAL | 0 refills | Status: DC

## 2016-08-12 MED ORDER — ENSURE ENLIVE PO LIQD: 237.0000 mL | Freq: Two times a day (BID) | ORAL | Status: DC

## 2016-08-12 NOTE — Evaluation (Signed)
Physical Therapy Evaluation Patient Details Name: Jessica [name] MRN: [id number] DOB: [birth year] Today's Date: 08/12/2016   History of Present Illness  Pt admitted with abdominal pain and dx with UTI and liver mass.  Pt with hx of CKD, MDD, and dementia  Clinical Impression  Pt admitted as above and presenting with obvious memory issues and unable to provide details on prior living arrangement or prior level of function.  Pt demonstrates ability to mobilize unassisted and with no balance or strength deficits.  No PT needs identified at this time and PT services will sign off at this time.    Follow Up Recommendations No PT follow up    Equipment Recommendations  None recommended by PT    Recommendations for Other Services       Precautions / Restrictions Precautions Precautions: Fall;Other (comment) Precaution Comments: memory deficits Restrictions Weight Bearing Restrictions: No      Mobility  Bed Mobility Overal bed mobility: Modified Independent             General bed mobility comments: Pt unassisted to/from bed  Transfers Overall transfer level: Modified independent               General transfer comment: Pt unassisted sit<>stand and with no obvious balance issues  Ambulation/Gait Ambulation/Gait assistance: Supervision;Independent Ambulation Distance (Feet): 500 Feet Assistive device: None Gait Pattern/deviations: WFL(Within Functional Limits) Gait velocity: mod pace   General Gait Details: Pt ambulating unassisted with cues only for direction.  Pt able to step fwd, back and sideways with no instability, no balance loss and good safety awareness  Stairs            Wheelchair Mobility    Modified Rankin (Stroke Patients Only)       Balance Overall balance assessment: No apparent balance deficits (not formally assessed)                                           Pertinent Vitals/Pain Pain Assessment: No/denies pain     Home Living Family/patient expects to be discharged to:: Private residence Living Arrangements: Other relatives Available Help at Discharge: Family             Additional Comments: Pt unclear on living arrangement other than that she lives with her sister and they live "where they have always lived"    Prior Function Level of Independence: Independent               Hand Dominance        Extremity/Trunk Assessment   Upper Extremity Assessment Upper Extremity Assessment: Overall WFL for tasks assessed    Lower Extremity Assessment Lower Extremity Assessment: Overall WFL for tasks assessed    Cervical / Trunk Assessment Cervical / Trunk Assessment: Normal  Communication   Communication: No difficulties;HOH  Cognition Arousal/Alertness: Awake/alert Behavior During Therapy: WFL for tasks assessed/performed Overall Cognitive Status: History of cognitive impairments - at baseline                                        General Comments      Exercises     Assessment/Plan    PT Assessment Patent does not need any further PT services  PT Problem List         PT Treatment Interventions  PT Goals (Current goals can be found in the Care Plan section)  Acute Rehab PT Goals Patient Stated Goal: HOME PT Goal Formulation: All assessment and education complete, DC therapy    Frequency     Barriers to discharge        Co-evaluation               AM-PAC PT "6 Clicks" Daily Activity  Outcome Measure Difficulty turning over in bed (including adjusting bedclothes, sheets and blankets)?: None Difficulty moving from lying on back to sitting on the side of the bed? : None Difficulty sitting down on and standing up from a chair with arms (e.g., wheelchair, bedside commode, etc,.)?: None Help needed moving to and from a bed to chair (including a wheelchair)?: None Help needed walking in hospital room?: None Help needed climbing 3-5  steps with a railing? : None 6 Click Score: 24    End of Session Equipment Utilized During Treatment: Gait belt Activity Tolerance: Patient tolerated treatment well Patient left: in bed;with call bell/phone within reach;with bed alarm set Nurse Communication: Mobility status PT Visit Diagnosis: Difficulty in walking, not elsewhere classified (R26.2)    Time: 5188-4166 PT Time Calculation (min) (ACUTE ONLY): 20 min   Charges:   PT Evaluation $PT Eval Low Complexity: 1 Procedure     PT G Codes:   PT G-Codes **NOT FOR INPATIENT CLASS** Functional Assessment Tool Used: AM-PAC 6 Clicks Basic Mobility Functional Limitation: Mobility: Walking and moving around Mobility: Walking and Moving Around Current Status (A6301): 0 percent impaired, limited or restricted Mobility: Walking and Moving Around Goal Status (S0109): 0 percent impaired, limited or restricted Mobility: Walking and Moving Around Discharge Status (N2355): 0 percent impaired, limited or restricted    Pg [phone]   Gita Dilger 08/12/2016, 9:42 AM

## 2016-08-12 NOTE — Discharge Summary (Signed)
Physician Discharge Summary  Jessica [name]:[id number] DOB: [birth year] DOA: 08/10/2016  PCP: Harlan Stains, MD  Admit date: 08/10/2016 Discharge date: 08/12/2016  Time spent: 35 minutes  Recommendations for Outpatient Follow-up:  1. Please repeat BMET to follow electrolytes and renal function  2. Please arrange for MRI as an outpatient to assess liver mass and determine need for further work up/treatment. 3. Please arrange follow up with Gyn for further evaluation/treatment of her thicken endometrium.   Discharge Diagnoses:  Nausea and vomiting Gram neg rods UTI Hyponatremia Liver mass Increased endometrial stripe thickness Hypothyroidism Dementia without behavioral disturbances Depression  Protein calorie malnutrition (moderate)   Discharge Condition: stable and improved. Discharge home with instructions to follow up with PCP in 1 week.  Diet recommendation: regular diet and feeding supplements.  Filed Weights   08/10/16 1756  Weight: 57.3 kg (126 lb 6 oz)    History of present illness:  As per H&P written by Dr. Alcario Drought on 08/11/16 81 y.o. female with medical history significant of likely early dementia.  Patient presents to the ED at [facility] with c/o Nausea.  Patient has had intermittent abd pain for the past few weeks, nausea, decreased appetite and weight loss.  Patient denies vomiting, fever.  Symptoms are persistent, nothing makes them better or worse.  History is somewhat limited by patients early dementia.  Hospital Course:  1-nausea/vomiting: -resolved -patient tolerating diet and PO meds  2-Acute gram neg rods -treated with rocephin -clinically improved  -discharge on ceftin to complete another 5 days  3-dehydration and hyponatremia -resolved with IVF's  4-hypothyroidism  -will continue synthroid   5-liver mass -concerns for cyst vs malignancy -will need liver MRI and depending results biopsy  6-endometrial thickening  -Seen on CT and pelvic  US -recommend Gyn follow up for further evaluation and treatment decision   7-dementia without behavioral disturbances -will continue exelon   8-depression/anxiety -continue sertraline   9-HLD -will continue pravachol  10-moderate protein calorie malnutrition  -discharge on ensure BID  Procedures:  See below for x-ray reports   Consultations:  None   Discharge Exam: Vitals:   08/11/16 2201 08/12/16 0600  BP: (!) 110/54 109/64  Pulse: 64 64  Resp: 16 16  Temp: 98.6 F (37 C) 97.7 F (36.5 C)    General: afebrile, no CP, no SOB, feeling much better and tolerating diet. Patient w/o nausea or vomiting. Cardiovascular: S1 and S2, no rubs or gallops Respiratory: CTA bilaterally abd: soft, NT, ND, positive BS Extremities: no edema or cyanosis   Discharge Instructions   Discharge Instructions    Diet - low sodium heart healthy    Complete by:  As directed    Discharge instructions    Complete by:  As directed    Keep yourself well hydrated Take medications as prescribed Follow up with PCP in 1 week     Current Discharge Medication List    START taking these medications   Details  cefUROXime (CEFTIN) 500 MG tablet Take 1 tablet (500 mg total) by mouth 2 (two) times daily with a meal. Qty: 10 tablet, Refills: 0    feeding supplement, ENSURE ENLIVE, (ENSURE ENLIVE) LIQD Take 237 mLs by mouth 2 (two) times daily between meals.      CONTINUE these medications which have NOT CHANGED   Details  Cholecalciferol (VITAMIN D3) 2000 units capsule Take 2,000 Units by mouth daily.     levothyroxine (SYNTHROID, LEVOTHROID) 75 MCG tablet Take 75 mcg by mouth daily before breakfast.  pravastatin (PRAVACHOL) 40 MG tablet Take 40 mg by mouth daily.     rivastigmine (EXELON) 3 MG capsule Take 1 capsule (3 mg total) by mouth 2 (two) times daily. Qty: 30 capsule, Refills: 5   Associated Diagnoses: Dementia without behavioral disturbance, unspecified dementia type     sertraline (ZOLOFT) 50 MG tablet Take 50 mg by mouth daily.       No Known Allergies Follow-up Information    Harlan Stains, MD Follow up.   Specialty:  Family Medicine Contact information: [address] [place] [address] [phone]           The results of significant diagnostics from this hospitalization (including imaging, microbiology, ancillary and laboratory) are listed below for reference.    Significant Diagnostic Studies: Dg Chest 2 View  Result Date: 08/10/2016 CLINICAL DATA:  81 year old female with lightheadedness, cough and weakness for the past several weeks. No known fever. EXAM: CHEST  2 VIEW COMPARISON:  Chest x-ray 07/05/2015. FINDINGS: Lung volumes are normal. No consolidative airspace disease. No pleural effusions. No pneumothorax. Large hiatal hernia. No pulmonary nodule or mass noted. Pulmonary vasculature and the cardiomediastinal silhouette are otherwise within normal limits. Aortic atherosclerosis. IMPRESSION: 1. No radiographic evidence of acute cardiopulmonary disease. 2. Aortic atherosclerosis. 3. Large hiatal hernia. Electronically Signed   By: Vinnie Langton M.D.   On: 08/10/2016 18:57   US Pelvis Complete  Result Date: 08/11/2016 CLINICAL DATA:  Increased endometrial stripe thickness. EXAM: TRANSABDOMINAL ULTRASOUND OF PELVIS TECHNIQUE: Transabdominal ultrasound examination of the pelvis was performed including evaluation of the uterus, ovaries, adnexal regions, and pelvic cul-de-sac. COMPARISON:  CT scan of Aug 10, 2016. FINDINGS: Uterus Measurements: 5.2 x 3.7 x 2.5 cm. No fibroids or other mass visualized. Endometrium Thickness: 10 mm which is abnormally thickened for a postmenopausal patient. Right ovary Not visualized due to overlying bowel gas. Left ovary Not visualized due to overlying bowel gas. Other findings:  No abnormal free fluid. IMPRESSION: Endometrial thickness is considered abnormal for an asymptomatic post-menopausal  female. Endometrial sampling should be considered to exclude carcinoma. Ovaries not visualized due to overlying bowel gas. Electronically Signed   By: Marijo Conception, M.D.   On: 08/11/2016 10:49   Ct Abdomen Pelvis W Contrast  Result Date: 08/10/2016 CLINICAL DATA:  81 y/o F; upper abdominal pain, cough, nausea, weight loss. History of COPD, HLD, GERD. EXAM: CT ABDOMEN AND PELVIS WITH CONTRAST TECHNIQUE: Multidetector CT imaging of the abdomen and pelvis was performed using the standard protocol following bolus administration of intravenous contrast. CONTRAST:  183mL ISOVUE-300 IOPAMIDOL (ISOVUE-300) INJECTION 61% COMPARISON:  None. FINDINGS: Lower chest: Moderate size hiatal hernia. Hepatobiliary: Multiple fluid attenuating foci well-circumscribed scattered throughout the liver compatible with cysts. The largest cyst is in the dome of the liver and measures up to 3.8 cm. Soft tissue mass at the posterior margin of segment 7 of the liver measuring 18 x 32 x 17 mm (AP x ML x CC series 2, image 24 and series 6, image 16). Pancreas: Unremarkable. No pancreatic ductal dilatation or surrounding inflammatory changes. Spleen: Normal in size without focal abnormality. Adrenals/Urinary Tract: Adrenal glands are unremarkable. Multiple parapelvic cysts within the hilum of the kidney bilaterally. Kidneys are otherwise normal, without renal calculi, focal lesion, or hydronephrosis. Bladder is unremarkable. Stomach/Bowel: Stomach is within normal limits. Appendix appears normal. No evidence of bowel wall thickening, distention, or inflammatory changes. Scattered sigmoid and descending colon diverticulosis. Vascular/Lymphatic: Aortic atherosclerosis with moderate to severe calcification. No  enlarged abdominal or pelvic lymph nodes. Reproductive: Thickening of the endometrium to 10 mm (series 6, image 44). No adnexal mass identified. Other: No abdominal wall hernia or abnormality. No abdominopelvic ascites. Musculoskeletal: No  acute or significant osseous findings. IMPRESSION: 1. No acute process identified as explanation for abdominal pain. 2. Mass within segment 7 of the liver measuring up to 3.2 cm may be metastatic or of primary hepatic/biliary origin. Possible multiple cystic lobulations suggests biliary cystadenoma/cystadenocarcinoma. Further characterization with liver MRI with and without contrast is recommended. This recommendation follows ACR consensus guidelines: Management of Incidental Liver Lesions on CT: A White Paper of the ACR Incidental Findings Committee. J Am Coll Radiol 2017; 62:5638-9373. 3. Thickened endometrium may represent an endometrial neoplasm or hyperplasia. Given patient's age further characterization with pelvic ultrasound is recommended. 4. Moderate-sized hiatal hernia containing a portion of the stomach. 5. Sigmoid diverticulosis. 6. Aortic atherosclerosis with calcifications. Electronically Signed   By: Kristine Garbe M.D.   On: 08/10/2016 21:23    Microbiology: Recent Results (from the past 240 hour(s))  Urine culture     Status: Abnormal (Preliminary result)   Collection Time: 08/10/16  6:13 PM  Result Value Ref Range Status   Specimen Description URINE, RANDOM  Final   Special Requests NONE  Final   Culture (A)  Final    >=100,000 COLONIES/mL GRAM NEGATIVE RODS CULTURE REINCUBATED FOR BETTER GROWTH Performed at [facility], [address], [place], [place] [address]    Report Status PENDING  Incomplete     Labs: Basic Metabolic Panel:  Recent Labs Lab 08/10/16 1813 08/11/16 0629 08/12/16 0322  NA 128* 135 137  K 3.7 3.4* 4.4  CL 95* 102 109  CO2 22 25 24   GLUCOSE 112* 92 96  BUN 10 6 11   CREATININE 0.99 0.90 0.86  CALCIUM 8.9 8.3* 8.0*   Liver Function Tests:  Recent Labs Lab 08/10/16 1813  AST 21  ALT 10*  ALKPHOS 98  BILITOT 0.4  PROT 7.3  ALBUMIN 3.8    Recent Labs Lab 08/10/16 1813  LIPASE 121*   CBC:  Recent Labs Lab  08/10/16 1813 08/12/16 0322  WBC 5.3 5.5  NEUTROABS 3.6  --   HGB 12.4 10.8*  HCT 34.8* 31.4*  MCV 87.0 89.5  PLT 266 249   Cardiac Enzymes:  Recent Labs Lab 08/10/16 1813  TROPONINI <0.03    Signed:  Barton Dubois MD.  [facility] 08/12/2016, 11:33 AM

## 2016-08-14 LAB — URINE CULTURE: Culture: >=100000 — AB

## 2016-08-16 DIAGNOSIS — D649 Anemia, unspecified: Secondary | ICD-10-CM | POA: Diagnosis not present

## 2016-08-16 DIAGNOSIS — N10 Acute tubulo-interstitial nephritis: Secondary | ICD-10-CM | POA: Diagnosis not present

## 2016-08-16 DIAGNOSIS — E871 Hypo-osmolality and hyponatremia: Secondary | ICD-10-CM | POA: Diagnosis not present

## 2016-08-16 DIAGNOSIS — R160 Hepatomegaly, not elsewhere classified: Secondary | ICD-10-CM | POA: Diagnosis not present

## 2016-08-16 DIAGNOSIS — R938 Abnormal findings on diagnostic imaging of other specified body structures: Secondary | ICD-10-CM | POA: Diagnosis not present

## 2016-08-17 ENCOUNTER — Other Ambulatory Visit (HOSPITAL_COMMUNITY): Payer: Self-pay | Admitting: Family Medicine

## 2016-08-17 DIAGNOSIS — R160 Hepatomegaly, not elsewhere classified: Secondary | ICD-10-CM

## 2016-08-25 ENCOUNTER — Encounter (HOSPITAL_COMMUNITY): Payer: Self-pay | Admitting: Radiology

## 2016-08-25 ENCOUNTER — Ambulatory Visit (HOSPITAL_COMMUNITY)
Admission: RE | Admit: 2016-08-25 | Discharge: 2016-08-25 | Disposition: A | Discharge status: Home / Self Care | Payer: Medicare HMO | Source: Ambulatory Visit | Attending: Family Medicine | Admitting: Family Medicine

## 2016-08-25 DIAGNOSIS — K449 Diaphragmatic hernia without obstruction or gangrene: Secondary | ICD-10-CM | POA: Insufficient documentation

## 2016-08-25 DIAGNOSIS — I700 Atherosclerosis of aorta: Secondary | ICD-10-CM | POA: Diagnosis not present

## 2016-08-25 DIAGNOSIS — R160 Hepatomegaly, not elsewhere classified: Secondary | ICD-10-CM | POA: Diagnosis not present

## 2016-08-25 DIAGNOSIS — K7689 Other specified diseases of liver: Secondary | ICD-10-CM | POA: Diagnosis not present

## 2016-08-25 MED ORDER — GADOBENATE DIMEGLUMINE 529 MG/ML IV SOLN
10.0000 mL | Freq: Once | INTRAVENOUS | Status: AC | PRN
  Administered 2016-08-25: 10 mL via INTRAVENOUS

## 2016-08-28 DIAGNOSIS — R110 Nausea: Secondary | ICD-10-CM | POA: Diagnosis not present

## 2016-08-28 DIAGNOSIS — K5900 Constipation, unspecified: Secondary | ICD-10-CM | POA: Diagnosis not present

## 2016-08-28 DIAGNOSIS — R160 Hepatomegaly, not elsewhere classified: Secondary | ICD-10-CM | POA: Diagnosis not present

## 2016-09-04 DIAGNOSIS — Z6822 Body mass index (BMI) 22.0-22.9, adult: Secondary | ICD-10-CM | POA: Diagnosis not present

## 2016-09-04 DIAGNOSIS — N905 Atrophy of vulva: Secondary | ICD-10-CM | POA: Diagnosis not present

## 2016-09-04 DIAGNOSIS — N8500 Endometrial hyperplasia, unspecified: Secondary | ICD-10-CM | POA: Diagnosis not present

## 2016-09-14 NOTE — Telephone Encounter (Signed)
Opened in error

## 2016-09-18 ENCOUNTER — Ambulatory Visit (INDEPENDENT_AMBULATORY_CARE_PROVIDER_SITE_OTHER): Payer: Medicare HMO | Admitting: Adult Health

## 2016-09-18 ENCOUNTER — Encounter: Payer: Self-pay | Admitting: Adult Health

## 2016-09-18 VITALS — BP 125/72 | HR 65 | Ht 60.0 in | Wt 129.0 lb

## 2016-09-18 DIAGNOSIS — F0390 Unspecified dementia without behavioral disturbance: Secondary | ICD-10-CM | POA: Diagnosis not present

## 2016-09-18 NOTE — Patient Instructions (Signed)
Memory score slightly declined since last visit- today 17/30 Consider Namenda for memory:  Memantine Tablets What is this medicine? MEMANTINE (MEM an teen) is used to treat dementia caused by Alzheimer's disease. This medicine may be used for other purposes; ask your health care provider or pharmacist if you have questions. COMMON BRAND NAME(S): Namenda What should I tell my health care provider before I take this medicine? They need to know if you have any of these conditions: -difficulty passing urine -kidney disease -liver disease -seizures -an unusual or allergic reaction to memantine, other medicines, foods, dyes, or preservatives -pregnant or trying to get pregnant -breast-feeding How should I use this medicine? Take this medicine by mouth with a glass of water. Follow the directions on the prescription label. You may take this medicine with or without food. Take your doses at regular intervals. Do not take your medicine more often than directed. Continue to take your medicine even if you feel better. Do not stop taking except on the advice of your doctor or health care professional. Talk to your pediatrician regarding the use of this medicine in children. Special care may be needed. Overdosage: If you think you have taken too much of this medicine contact a poison control center or emergency room at once. NOTE: This medicine is only for you. Do not share this medicine with others. What if I miss a dose? If you miss a dose, take it as soon as you can. If it is almost time for your next dose, take only that dose. Do not take double or extra doses. If you do not take your medicine for several days, contact your health care provider. Your dose may need to be changed. What may interact with this medicine? -acetazolamide -amantadine -cimetidine -dextromethorphan -dofetilide -hydrochlorothiazide -ketamine -metformin -methazolamide -quinidine -ranitidine -sodium  bicarbonate -triamterene This list may not describe all possible interactions. Give your health care provider a list of all the medicines, herbs, non-prescription drugs, or dietary supplements you use. Also tell them if you smoke, drink alcohol, or use illegal drugs. Some items may interact with your medicine. What should I watch for while using this medicine? Visit your doctor or health care professional for regular checks on your progress. Check with your doctor or health care professional if there is no improvement in your symptoms or if they get worse. You may get drowsy or dizzy. Do not drive, use machinery, or do anything that needs mental alertness until you know how this drug affects you. Do not stand or sit up quickly, especially if you are an older patient. This reduces the risk of dizzy or fainting spells. Alcohol can make you more drowsy and dizzy. Avoid alcoholic drinks. What side effects may I notice from receiving this medicine? Side effects that you should report to your doctor or health care professional as soon as possible: -allergic reactions like skin rash, itching or hives, swelling of the face, lips, or tongue -agitation or a feeling of restlessness -depressed mood -dizziness -hallucinations -redness, blistering, peeling or loosening of the skin, including inside the mouth -seizures -vomiting Side effects that usually do not require medical attention (report to your doctor or health care professional if they continue or are bothersome): -constipation -diarrhea -headache -nausea -trouble sleeping This list may not describe all possible side effects. Call your doctor for medical advice about side effects. You may report side effects to FDA at 1-800-FDA-1088. Where should I keep my medicine? Keep out of the reach of children. Store at  room temperature between 15 degrees and 30 degrees C (59 degrees and 86 degrees F). Throw away any unused medicine after the expiration  date. NOTE: This sheet is a summary. It may not cover all possible information. If you have questions about this medicine, talk to your doctor, pharmacist, or health care provider.  2018 Elsevier/Gold Standard (2013-01-12 14:10:42)

## 2016-09-18 NOTE — Progress Notes (Addendum)
PATIENT: Jessica [name] DOB: December 10, 1935  REASON FOR VISIT: follow up- memory disturbance HISTORY FROM: patient  HISTORY OF PRESENT ILLNESS: Today 09/18/16: Jessica [name] is a 81 year old female with a history of memory disturbance. She returns today for follow-up. She was taking the Exelon capsules however her sister reports that she had severe stomach upset and her primary care advised that she stop the medication to see if the symptoms resolved. She reports that the symptoms did resolve therefore she never restarted the medication. The patient lives with her sister. She is able to complete all ADLs. She no longer operates a motor vehicle. Her sister handles all of her finances. She denies any trouble sleeping. Denies any changes in her. She has tried Aricept in the past however she was unable to tolerate this medication as well. She returns today for an evaluation.  HISTORY Copied From Dr. Guadelupe Sabin notes: Jessica [name] is a 81 year old right-handed woman with an underlying medical history of thyroid disease, vitamin D deficiency, reflux disease, depression, chronic kidney disease, hyperlipidemia, anxiety and overweight state, who presents for follow-up consultation of her memory loss. The patient is accompanied by her sister, [name], again today. I last saw her on 03/12/2016, at which time she reported doing okay, she had no specific complaints, but was not taking her memory medication and gave no reason for it. She had foot pain bilaterally, has a history of hammertoes and bunions. She did not have a recent fall. Sr. described 2 episodes of decreased attentiveness and staring, no actual convulsions. Her sister moved in with the patient in September 2017. Her MMSE was 19/30, CDT: 3/4, AFT: 6/min. I suggested she fill her prescription for Exelon capsules. She could not afford Exelon patches and did not tolerate or reported not being able to tolerate Aricept generic. I suggested further workup with EEG.  She had this on 04/18/2016. This was reported as normal in the awake and drowsy states. We called with test results.   06/14/2016 (all dictated new, as well as above notes, some dictation done in note pad or Word, outside of chart, may appear as copied): She reports doing okay, feels forgetful. Other than that she has not had any recent problems, no other falls. Of note, she unfortunately had a fall in January. She landed on ice and hit her face after landing on her knees. Head CT without contrast showed no acute intracranial abnormality. I reviewed the emergency room records from 05/08/2016. Her sister reports no additional incidence of zoning out spells or staring spells. We talked about the EEG today as well. Sister reports that a couple of times patient did not take her sertraline are the evening dose of Exelon and when patient was reminded sister states that she almost argued about it. She tried to not argue about it too much but took out the pills and placed him on the counter and when patient finally realized that she did not take them she took them.   The patient's allergies, current medications, family history, past medical history, past social history, past surgical history and problem list were reviewed and updated as appropriate  REVIEW OF SYSTEMS: Out of a complete 14 system review of symptoms, the patient complains only of the following symptoms, and all other reviewed systems are negative.  Memory loss  ALLERGIES: No Known Allergies  HOME MEDICATIONS: Outpatient Medications Prior to Visit  Medication Sig Dispense Refill  . cefUROXime (CEFTIN) 500 MG tablet Take 1 tablet (500 mg total) by  mouth 2 (two) times daily with a meal. 10 tablet 0  . Cholecalciferol (VITAMIN D3) 2000 units capsule Take 2,000 Units by mouth daily.     . feeding supplement, ENSURE ENLIVE, (ENSURE ENLIVE) LIQD Take 237 mLs by mouth 2 (two) times daily between meals.    Marland Kitchen levothyroxine (SYNTHROID, LEVOTHROID)  75 MCG tablet Take 75 mcg by mouth daily before breakfast.    . pravastatin (PRAVACHOL) 40 MG tablet Take 40 mg by mouth daily.     . sertraline (ZOLOFT) 50 MG tablet Take 50 mg by mouth daily.    . rivastigmine (EXELON) 3 MG capsule Take 1 capsule (3 mg total) by mouth 2 (two) times daily. 30 capsule 5   No facility-administered medications prior to visit.     PAST MEDICAL HISTORY: Past Medical History:  Diagnosis Date  . Anxiety   . CKD (chronic kidney disease), stage III   . GERD without esophagitis   . Hyperlipemia   . Major depressive disorder, single episode, unspecified   . Memory difficulty   . Thyroid disease   . Vitamin D deficiency     PAST SURGICAL HISTORY: Past Surgical History:  Procedure Laterality Date  . BREAST BIOPSY Left   . CATARACT EXTRACTION, BILATERAL    . GLAUCOMA SURGERY    . WRIST SURGERY Left     FAMILY HISTORY: Family History  Problem Relation Age of Onset  . CAD Father   . CAD Brother   . CVA Paternal Grandmother     SOCIAL HISTORY: Social History   Social History  . Marital status: Single    Spouse name: N/A  . Number of children: 0  . Years of education: college    Occupational History  . Retired    Social History Main Topics  . Smoking status: Never Smoker  . Smokeless tobacco: Never Used  . Alcohol use No  . Drug use: No  . Sexual activity: Not on file   Other Topics Concern  . Not on file   Social History Narrative   Drinks 1 cup of coffee a day       PHYSICAL EXAM  Vitals:   09/18/16 1113  BP: 125/72  Pulse: 65  Weight: 129 lb (58.5 kg)  Height: 5' (1.524 m)   Body mass index is 25.19 kg/m.   MMSE - Mini Mental State Exam 09/18/2016 03/12/2016 08/08/2015  Orientation to time 2 4 4   Orientation to Place 2 3 3   Registration 3 3 3   Attention/ Calculation 2 1 4   Recall 0 0 1  Language- name 2 objects 2 2 2   Language- repeat 1 1 1   Language- follow 3 step command 3 3 3   Language- read & follow direction 1  1 1   Write a sentence 1 1 1   Copy design 0 0 1  Total score 17 19 24       Generalized: Well developed, in no acute distress   Neurological examination  Mentation: Alert. Follows all commands speech and language fluent Cranial nerve II-XII: Pupils were equal round reactive to light. Extraocular movements were full, visual field were full on confrontational test. Facial sensation and strength were normal. Uvula tongue midline. Head turning and shoulder shrug  were normal and symmetric. Motor: The motor testing reveals 5 over 5 strength of all 4 extremities. Good symmetric motor tone is noted throughout.  Sensory: Sensory testing is intact to soft touch on all 4 extremities. No evidence of extinction is noted.  Coordination: Cerebellar  testing reveals good finger-nose-finger and heel-to-shin bilaterally.  Gait and station: Gait is normal.   Reflexes: Deep tendon reflexes are symmetric and normal bilaterally.   DIAGNOSTIC DATA (LABS, IMAGING, TESTING) - I reviewed patient records, labs, notes, testing and imaging myself where available.  Lab Results  Component Value Date   WBC 5.5 08/12/2016   HGB 10.8 (L) 08/12/2016   HCT 31.4 (L) 08/12/2016   MCV 89.5 08/12/2016   PLT 249 08/12/2016      Component Value Date/Time   NA 137 08/12/2016 0322   K 4.4 08/12/2016 0322   CL 109 08/12/2016 0322   CO2 24 08/12/2016 0322   GLUCOSE 96 08/12/2016 0322   BUN 11 08/12/2016 0322   CREATININE 0.86 08/12/2016 0322   CALCIUM 8.0 (L) 08/12/2016 0322   PROT 7.3 08/10/2016 1813   ALBUMIN 3.8 08/10/2016 1813   AST 21 08/10/2016 1813   ALT 10 (L) 08/10/2016 1813   ALKPHOS 98 08/10/2016 1813   BILITOT 0.4 08/10/2016 1813   GFRNONAA >60 08/12/2016 0322   GFRAA >60 08/12/2016 0322     ASSESSMENT AND PLAN 81 y.o. year old female  has a past medical history of Anxiety; CKD (chronic kidney disease), stage III; GERD without esophagitis; Hyperlipemia; Major depressive disorder, single episode,  unspecified; Memory difficulty; Thyroid disease; and Vitamin D deficiency. here with:  1. Dementia with behavior disturbance  The patient's memory score has declined slightly since her visit in December. She has been unable to tolerate Aricept and now Exelon. I advised that we could consider starting Namenda. However the patient deferred for now. She states that she is unsure if she wants to start any new medication. I provided the patient with a handout reviewing Namenda. They will let us know if they would like to start this. Patient is advised that if her symptoms worsen or she develops new symptoms they should let us know. She will follow-up in 6 months with Dr. Rexene Alberts  I spent 15 minutes with the patient. 50% of this time was spent reviewing her memory score and Namenda.    Ward Givens, MSN, NP-C 09/18/2016, 11:33 AM [facility] [address] [place], [place] [address] [phone]  I reviewed the above note and documentation by the Nurse Practitioner and agree with the history, physical exam, assessment and plan as outlined above. I was immediately available for face-to-face consultation. Star Age, MD, PhD [facility])

## 2016-09-24 DIAGNOSIS — F0390 Unspecified dementia without behavioral disturbance: Secondary | ICD-10-CM | POA: Diagnosis not present

## 2016-09-24 DIAGNOSIS — R160 Hepatomegaly, not elsewhere classified: Secondary | ICD-10-CM | POA: Diagnosis not present

## 2016-09-24 DIAGNOSIS — E441 Mild protein-calorie malnutrition: Secondary | ICD-10-CM | POA: Diagnosis not present

## 2016-09-24 DIAGNOSIS — E039 Hypothyroidism, unspecified: Secondary | ICD-10-CM | POA: Diagnosis not present

## 2016-09-24 DIAGNOSIS — F419 Anxiety disorder, unspecified: Secondary | ICD-10-CM | POA: Diagnosis not present

## 2016-09-24 DIAGNOSIS — F329 Major depressive disorder, single episode, unspecified: Secondary | ICD-10-CM | POA: Diagnosis not present

## 2016-09-24 DIAGNOSIS — E871 Hypo-osmolality and hyponatremia: Secondary | ICD-10-CM | POA: Diagnosis not present

## 2016-09-24 DIAGNOSIS — R110 Nausea: Secondary | ICD-10-CM | POA: Diagnosis not present

## 2016-09-25 ENCOUNTER — Ambulatory Visit (INDEPENDENT_AMBULATORY_CARE_PROVIDER_SITE_OTHER): Payer: Medicare HMO | Admitting: Podiatry

## 2016-09-25 ENCOUNTER — Encounter: Payer: Self-pay | Admitting: Podiatry

## 2016-09-25 DIAGNOSIS — M79671 Pain in right foot: Secondary | ICD-10-CM

## 2016-09-25 DIAGNOSIS — M2042 Other hammer toe(s) (acquired), left foot: Secondary | ICD-10-CM | POA: Diagnosis not present

## 2016-09-25 DIAGNOSIS — M2041 Other hammer toe(s) (acquired), right foot: Secondary | ICD-10-CM

## 2016-09-25 DIAGNOSIS — M79672 Pain in left foot: Secondary | ICD-10-CM | POA: Diagnosis not present

## 2016-09-25 DIAGNOSIS — B351 Tinea unguium: Secondary | ICD-10-CM

## 2016-09-25 NOTE — Patient Instructions (Signed)
Seen for hypertrophic nails and painful corn 2nd toe right. All nails debrided. All calluses debrided. 2nd toe right padded. Return in 3 months or as needed.

## 2016-09-25 NOTE — Progress Notes (Signed)
SUBJECTIVE: 81 y.o.year old femalepresents requesting toe nails trimmed. Also have painful corn on 2nd toe right.  Patient is a hard of hearing. Denies any new problems.  OBJECTIVE: DERMATOLOGIC EXAMINATION: Thick dystrophic nails x 10. Painful corn 2nd toe right, pinch callus under the first MPJ bilateral. VASCULAR EXAMINATION OF LOWER LIMBS: All pedal pulses are palpable with normal pulsation.  NEUROLOGIC EXAMINATION OF THE LOWER LIMBS: All epicritic and tactile sensations grossly intact.  MUSCULOSKELETAL EXAMINATION: Positive for severe Valgus deformity of both great toe sitting under the 2nd toe bilateral. Medially deviated 2nd toe with dorsal digital corn over PIPJ due to under riding hallux.   ASSESSMENT: Subluxed 1st and 2nd MPJ bilateral. Severe HAV with bunion deformity bilateral. Severe Hammer toe deformity with digital corns. Mycotic nails x 10. Pain in both feet.  PLAN: Reviewed clinical findings and available treatment options.  All lesions debrided. 2nd toe right padded. All nails debrided. Return in 3 moths.

## 2016-09-26 ENCOUNTER — Other Ambulatory Visit (HOSPITAL_COMMUNITY): Payer: Self-pay | Admitting: General Surgery

## 2016-09-26 DIAGNOSIS — R160 Hepatomegaly, not elsewhere classified: Secondary | ICD-10-CM

## 2016-10-05 ENCOUNTER — Other Ambulatory Visit: Payer: Self-pay | Admitting: Radiology

## 2016-10-08 ENCOUNTER — Ambulatory Visit (HOSPITAL_COMMUNITY)
Admission: RE | Admit: 2016-10-08 | Discharge: 2016-10-08 | Disposition: A | Discharge status: Home / Self Care | Payer: Medicare HMO | Source: Ambulatory Visit | Attending: General Surgery | Admitting: General Surgery

## 2016-10-08 ENCOUNTER — Encounter (HOSPITAL_COMMUNITY): Payer: Self-pay

## 2016-10-08 DIAGNOSIS — K769 Liver disease, unspecified: Secondary | ICD-10-CM | POA: Diagnosis not present

## 2016-10-08 DIAGNOSIS — F0390 Unspecified dementia without behavioral disturbance: Secondary | ICD-10-CM | POA: Insufficient documentation

## 2016-10-08 DIAGNOSIS — R634 Abnormal weight loss: Secondary | ICD-10-CM | POA: Insufficient documentation

## 2016-10-08 DIAGNOSIS — N183 Chronic kidney disease, stage 3 (moderate): Secondary | ICD-10-CM | POA: Insufficient documentation

## 2016-10-08 DIAGNOSIS — Z79899 Other long term (current) drug therapy: Secondary | ICD-10-CM | POA: Insufficient documentation

## 2016-10-08 DIAGNOSIS — K7689 Other specified diseases of liver: Secondary | ICD-10-CM | POA: Diagnosis not present

## 2016-10-08 DIAGNOSIS — E785 Hyperlipidemia, unspecified: Secondary | ICD-10-CM | POA: Insufficient documentation

## 2016-10-08 DIAGNOSIS — F329 Major depressive disorder, single episode, unspecified: Secondary | ICD-10-CM | POA: Insufficient documentation

## 2016-10-08 DIAGNOSIS — E079 Disorder of thyroid, unspecified: Secondary | ICD-10-CM | POA: Insufficient documentation

## 2016-10-08 DIAGNOSIS — E559 Vitamin D deficiency, unspecified: Secondary | ICD-10-CM | POA: Diagnosis not present

## 2016-10-08 DIAGNOSIS — R109 Unspecified abdominal pain: Secondary | ICD-10-CM | POA: Insufficient documentation

## 2016-10-08 DIAGNOSIS — K449 Diaphragmatic hernia without obstruction or gangrene: Secondary | ICD-10-CM | POA: Diagnosis not present

## 2016-10-08 DIAGNOSIS — F419 Anxiety disorder, unspecified: Secondary | ICD-10-CM | POA: Diagnosis not present

## 2016-10-08 DIAGNOSIS — R160 Hepatomegaly, not elsewhere classified: Secondary | ICD-10-CM

## 2016-10-08 DIAGNOSIS — I700 Atherosclerosis of aorta: Secondary | ICD-10-CM | POA: Diagnosis not present

## 2016-10-08 DIAGNOSIS — K219 Gastro-esophageal reflux disease without esophagitis: Secondary | ICD-10-CM | POA: Diagnosis not present

## 2016-10-08 LAB — CBC
HEMATOCRIT: 35.1 % — AB (ref 36.0–46.0)
Hemoglobin: 12.1 g/dL (ref 12.0–15.0)
MCH: 31 pg (ref 26.0–34.0)
MCHC: 34.5 g/dL (ref 30.0–36.0)
MCV: 90 fL (ref 78.0–100.0)
Platelets: 277 10*3/uL (ref 150–400)
RBC: 3.9 MIL/uL (ref 3.87–5.11)
RDW: 12.9 % (ref 11.5–15.5)
WBC: 4.9 10*3/uL (ref 4.0–10.5)

## 2016-10-08 LAB — APTT: aPTT: 47 seconds — ABNORMAL HIGH (ref 24–36)

## 2016-10-08 LAB — PROTIME-INR
INR: 1.51
Prothrombin Time: 18.3 seconds — ABNORMAL HIGH (ref 11.4–15.2)

## 2016-10-08 MED ORDER — FENTANYL CITRATE (PF) 100 MCG/2ML IJ SOLN
INTRAMUSCULAR | Status: AC
  Filled 2016-10-08: qty 2

## 2016-10-08 MED ORDER — FENTANYL CITRATE (PF) 100 MCG/2ML IJ SOLN
INTRAMUSCULAR | Status: AC | PRN
  Administered 2016-10-08: 50 ug via INTRAVENOUS
  Administered 2016-10-08 (×2): 25 ug via INTRAVENOUS

## 2016-10-08 MED ORDER — MIDAZOLAM HCL 2 MG/2ML IJ SOLN
INTRAMUSCULAR | Status: AC | PRN
  Administered 2016-10-08 (×4): 0.5 mg via INTRAVENOUS

## 2016-10-08 MED ORDER — LIDOCAINE HCL (PF) 1 % IJ SOLN
INTRAMUSCULAR | Status: AC | PRN
  Administered 2016-10-08: 10 mg

## 2016-10-08 MED ORDER — SODIUM CHLORIDE 0.9 % IV SOLN
INTRAVENOUS | Status: DC
  Administered 2016-10-08: 09:00:00 via INTRAVENOUS

## 2016-10-08 MED ORDER — MIDAZOLAM HCL 2 MG/2ML IJ SOLN
INTRAMUSCULAR | Status: AC
  Filled 2016-10-08: qty 6

## 2016-10-08 NOTE — Sedation Documentation (Signed)
Patient is resting comfortably. 

## 2016-10-08 NOTE — Consult Note (Signed)
Chief Complaint: Patient was seen in consultation today for   Referring Physician(s): Byerly,Faera  Supervising Physician: Aletta Edouard  Patient Status: [facility] - Out-pt  History of Present Illness: Jessica [name] is a 81 y.o. female with history of chronic kidney disease, GERD, dementia, abdominal pain, nausea, weight loss and recent imaging revealing:  1. Multilocular cystic tumor along the posterior aspect of segment 7 of the liver, with septations and possible mural nodule.  Cannot exclude biliary cystadenoma or biliary cystadenocarcinoma although the exophytic nature of this lesion would be unusual. 2. There several other cystic lesions in the liver, 2 of which may also have subtle septations, but no mural nodularity, which should probably be surveilled. 3. Large hiatal hernia. 4.  Aortic Atherosclerosis   She presents today for image guided biopsy of the dominant liver lesion for further evaluation.No known hx of cancer.   Past Medical History:  Diagnosis Date  . Anxiety   . CKD (chronic kidney disease), stage III   . GERD without esophagitis   . Hyperlipemia   . Major depressive disorder, single episode, unspecified   . Memory difficulty   . Thyroid disease   . Vitamin D deficiency     Past Surgical History:  Procedure Laterality Date  . BREAST BIOPSY Left   . CATARACT EXTRACTION, BILATERAL    . GLAUCOMA SURGERY    . WRIST SURGERY Left     Allergies: Patient has no known allergies.  Medications: Prior to Admission medications   Medication Sig Start Date End Date Taking? Authorizing Provider  Cholecalciferol (VITAMIN D3) 2000 units capsule Take 2,000 Units by mouth daily.    Yes [provider]  esomeprazole (NEXIUM) 20 MG capsule Take 20 mg by mouth daily at 12 noon.   Yes [provider]  feeding supplement, ENSURE ENLIVE, (ENSURE ENLIVE) LIQD Take 237 mLs by mouth 2 (two) times daily between meals. 08/12/16  Yes Barton Dubois, MD    levothyroxine (SYNTHROID, LEVOTHROID) 75 MCG tablet Take 75 mcg by mouth daily before breakfast.   Yes [provider]  pravastatin (PRAVACHOL) 40 MG tablet Take 40 mg by mouth daily.  05/11/16  Yes [provider]  sertraline (ZOLOFT) 50 MG tablet Take 50 mg by mouth daily. 02/24/16  Yes [provider]  cefUROXime (CEFTIN) 500 MG tablet Take 1 tablet (500 mg total) by mouth 2 (two) times daily with a meal. 08/12/16   Barton Dubois, MD     Family History  Problem Relation Age of Onset  . CAD Father   . CAD Brother   . CVA Paternal Grandmother     Social History   Social History  . Marital status: Single    Spouse name: N/A  . Number of children: 0  . Years of education: college    Occupational History  . Retired    Social History Main Topics  . Smoking status: Never Smoker  . Smokeless tobacco: Never Used  . Alcohol use No  . Drug use: No  . Sexual activity: Not Asked   Other Topics Concern  . None   Social History Narrative   Drinks 1 cup of coffee a day       Review of Systems currently denies fever, headache, chest pain, dyspnea, cough, nausea, vomiting or abnormal bleeding. She has had some intermittent abdominal and back discomfort.  Vital Signs: BP 125/64 (BP Location: Right Arm)   Pulse 65   Temp 97.9 F (36.6 C) (Oral)  Resp 16   SpO2 100%   Physical Exam  patient awake, chest clear to auscultation bilaterally. Heart with regular rate and rhythm. Abdomen soft, positive bowel sounds, nontender; lower extremities- no edema. Mallampati Score:     Imaging: No results found.  Labs:  CBC:  Recent Labs  08/10/16 1813 08/12/16 0322  WBC 5.3 5.5  HGB 12.4 10.8*  HCT 34.8* 31.4*  PLT 266 249    COAGS: No results for input(s): INR, APTT in the last 8760 hours.  BMP:  Recent Labs  08/10/16 1813 08/11/16 0629 08/12/16 0322  NA 128* 135 137  K 3.7 3.4* 4.4  CL 95* 102 109  CO2 22 25 24   GLUCOSE 112* 92 96   BUN 10 6 11   CALCIUM 8.9 8.3* 8.0*  CREATININE 0.99 0.90 0.86  GFRNONAA 53* 60* >60  GFRAA >60 >60 >60    LIVER FUNCTION TESTS:  Recent Labs  08/10/16 1813  BILITOT 0.4  AST 21  ALT 10*  ALKPHOS 98  PROT 7.3  ALBUMIN 3.8    TUMOR MARKERS: No results for input(s): AFPTM, CEA, CA199, CHROMGRNA in the last 8760 hours.  Assessment and Plan: 81 y.o. female with history of chronic kidney disease, GERD, dementia, abdominal pain, nausea, weight loss and recent imaging revealing:  1. Multilocular cystic tumor along the posterior aspect of segment 7 of the liver, with septations and possible mural nodule.  Cannot exclude biliary cystadenoma or biliary cystadenocarcinoma although the exophytic nature of this lesion would be unusual. 2. There several other cystic lesions in the liver, 2 of which may also have subtle septations, but no mural nodularity, which should probably be surveilled. 3. Large hiatal hernia. 4.  Aortic Atherosclerosis   She presents today for image guided biopsy of the dominant liver lesion for further evaluation.Risks and benefits discussed with the patient/sister including, but not limited to bleeding, infection, damage to adjacent structures or low yield requiring additional tests. All of the patient's questions were answered, patient is agreeable to proceed. Consent signed and in chart. Labs pending.      Thank you for this interesting consult.  I greatly enjoyed meeting Tiney Zipper Krouse and look forward to participating in their care.  A copy of this report was sent to the requesting provider on this date.  Electronically Signed: D. Rowe Robert, PA-C 10/08/2016, 9:21 AM   I spent a total of 25 minutes  in face to face in clinical consultation, greater than 50% of which was counseling/coordinating care for image guided liver lesion biopsy

## 2016-10-08 NOTE — Sedation Documentation (Signed)
Patient is resting comfortably. 

## 2016-10-08 NOTE — Sedation Documentation (Signed)
Patient denies pain and is resting comfortably.  

## 2016-10-08 NOTE — Sedation Documentation (Signed)
Patient is resting comfortably with moments of discomfort. NAD.

## 2016-10-08 NOTE — Sedation Documentation (Signed)
Vital signs stable. 

## 2016-10-08 NOTE — Discharge Instructions (Signed)
Moderate Conscious Sedation, Adult, Care After These instructions provide you with information about caring for yourself after your procedure. Your health care provider may also give you more specific instructions. Your treatment has been planned according to current medical practices, but problems sometimes occur. Call your health care provider if you have any problems or questions after your procedure. What can I expect after the procedure? After your procedure, it is common:  To feel sleepy for several hours.  To feel clumsy and have poor balance for several hours.  To have poor judgment for several hours.  To vomit if you eat too soon.  Follow these instructions at home: For at least 24 hours after the procedure:   Do not: ? Participate in activities where you could fall or become injured. ? Drive. ? Use heavy machinery. ? Drink alcohol. ? Take sleeping pills or medicines that cause drowsiness. ? Make important decisions or sign legal documents. ? Take care of children on your own.  Rest. Eating and drinking  Follow the diet recommended by your health care provider.  If you vomit: ? Drink water, juice, or soup when you can drink without vomiting. ? Make sure you have little or no nausea before eating solid foods. General instructions  Have a responsible adult stay with you until you are awake and alert.  Take over-the-counter and prescription medicines only as told by your health care provider.  If you smoke, do not smoke without supervision.  Keep all follow-up visits as told by your health care provider. This is important. Contact a health care provider if:  You keep feeling nauseous or you keep vomiting.  You feel light-headed.  You develop a rash.  You have a fever. Get help right away if:  You have trouble breathing. This information is not intended to replace advice given to you by your health care provider. Make sure you discuss any questions you have  with your health care provider. Document Released: 01/14/2013 Document Revised: 08/29/2015 Document Reviewed: 07/16/2015 Elsevier Interactive Patient Education  2018 [place]. Bone Marrow Aspiration and Bone Marrow Biopsy, Adult, Care After This sheet gives you information about how to care for yourself after your procedure. Your health care provider may also give you more specific instructions. If you have problems or questions, contact your health care provider. What can I expect after the procedure? After the procedure, it is common to have:  Mild pain and tenderness.  Swelling.  Bruising.  Follow these instructions at home:  Take over-the-counter or prescription medicines only as told by your health care provider.  Do not take baths, swim, or use a hot tub until your health care provider approves. Ask if you can take a shower or have a sponge bath.  Follow instructions from your health care provider about how to take care of the puncture site. Make sure you: ? Wash your hands with soap and water before you change your bandage (dressing). If soap and water are not available, use hand sanitizer. ? Change your dressing as told by your health care provider.  Check your puncture siteevery day for signs of infection. Check for: ? More redness, swelling, or pain. ? More fluid or blood. ? Warmth. ? Pus or a bad smell.  Return to your normal activities as told by your health care provider. Ask your health care provider what activities are safe for you.  Do not drive for 24 hours if you were given a medicine to help you relax (sedative).  Keep all follow-up visits as told by your health care provider. This is important. °Contact a health care provider if: °· You have more redness, swelling, or pain around the puncture site. °· You have more fluid or blood coming from the puncture site. °· Your puncture site feels warm to the touch. °· You have pus or a bad smell coming from the  puncture site. °· You have a fever. °· Your pain is not controlled with medicine. °This information is not intended to replace advice given to you by your health care provider. Make sure you discuss any questions you have with your health care provider. °Document Released: 10/13/2004 Document Revised: 10/14/2015 Document Reviewed: 09/07/2015 °Elsevier Interactive Patient Education © 2018 [employer] ° °

## 2016-10-08 NOTE — Procedures (Signed)
Interventional Radiology Procedure Note  Procedure: CT guided biopsy of liver lesion  Complications: None  Estimated Blood Loss: None  18 G needle advanced into cystic lesion of right lobe of liver.  22 mL of clear fluid aspirated.  Lesion completely decompressed by CT after aspiration. Fluid sent for cytologic analysis.  Venetia Night. Kathlene Cote, M.D Pager:  [phone]

## 2016-10-09 NOTE — Progress Notes (Signed)
Please let patient know pathology is benign.

## 2016-11-10 ENCOUNTER — Emergency Department (HOSPITAL_BASED_OUTPATIENT_CLINIC_OR_DEPARTMENT_OTHER): Payer: Medicare HMO

## 2016-11-10 ENCOUNTER — Emergency Department (HOSPITAL_BASED_OUTPATIENT_CLINIC_OR_DEPARTMENT_OTHER)
Admission: EM | Admit: 2016-11-10 | Discharge: 2016-11-10 | Disposition: A | Discharge status: Home / Self Care | Payer: Medicare HMO | Attending: Emergency Medicine | Admitting: Emergency Medicine

## 2016-11-10 ENCOUNTER — Encounter (HOSPITAL_BASED_OUTPATIENT_CLINIC_OR_DEPARTMENT_OTHER): Payer: Self-pay | Admitting: *Deleted

## 2016-11-10 DIAGNOSIS — N183 Chronic kidney disease, stage 3 (moderate): Secondary | ICD-10-CM | POA: Diagnosis not present

## 2016-11-10 DIAGNOSIS — W01198A Fall on same level from slipping, tripping and stumbling with subsequent striking against other object, initial encounter: Secondary | ICD-10-CM | POA: Diagnosis not present

## 2016-11-10 DIAGNOSIS — S0083XA Contusion of other part of head, initial encounter: Secondary | ICD-10-CM | POA: Diagnosis not present

## 2016-11-10 DIAGNOSIS — S00211A Abrasion of right eyelid and periocular area, initial encounter: Secondary | ICD-10-CM | POA: Diagnosis not present

## 2016-11-10 DIAGNOSIS — Z79899 Other long term (current) drug therapy: Secondary | ICD-10-CM | POA: Diagnosis not present

## 2016-11-10 DIAGNOSIS — S0990XA Unspecified injury of head, initial encounter: Secondary | ICD-10-CM | POA: Diagnosis not present

## 2016-11-10 DIAGNOSIS — M79641 Pain in right hand: Secondary | ICD-10-CM | POA: Diagnosis not present

## 2016-11-10 DIAGNOSIS — S199XXA Unspecified injury of neck, initial encounter: Secondary | ICD-10-CM | POA: Diagnosis not present

## 2016-11-10 DIAGNOSIS — E039 Hypothyroidism, unspecified: Secondary | ICD-10-CM | POA: Insufficient documentation

## 2016-11-10 DIAGNOSIS — Y9389 Activity, other specified: Secondary | ICD-10-CM | POA: Diagnosis not present

## 2016-11-10 DIAGNOSIS — Y92018 Other place in single-family (private) house as the place of occurrence of the external cause: Secondary | ICD-10-CM | POA: Diagnosis not present

## 2016-11-10 DIAGNOSIS — Y999 Unspecified external cause status: Secondary | ICD-10-CM | POA: Insufficient documentation

## 2016-11-10 DIAGNOSIS — W19XXXA Unspecified fall, initial encounter: Secondary | ICD-10-CM

## 2016-11-10 DIAGNOSIS — T148XXA Other injury of unspecified body region, initial encounter: Secondary | ICD-10-CM

## 2016-11-10 DIAGNOSIS — S8991XA Unspecified injury of right lower leg, initial encounter: Secondary | ICD-10-CM | POA: Diagnosis not present

## 2016-11-10 NOTE — ED Provider Notes (Signed)
[facility] Provider Note   CSN: [id number] Arrival date & time: 11/10/16  1146     History   Chief Complaint Chief Complaint  Patient presents with  . Fall    HPI Jessica [name] is a 81 y.o. female who presents with right wrist pain, right knee pain, abrasion and bruising to right thigh after a mechanical fall yesterday. Patient reports she tripped and fell at her mailbox and hit her head on the street. She did not lose consciousness. She sustained a minor abrasion above her right eyebrow. She has been ambulatory since. She has taken Tylenol with some relief. She denies any neck pain, back pain, shortness of breath, chest pain, abdominal pain, nausea, vomiting, urinary symptoms.  HPI  Past Medical History:  Diagnosis Date  . Anxiety   . CKD (chronic kidney disease), stage III   . GERD without esophagitis   . Hyperlipemia   . Major depressive disorder, single episode, unspecified   . Memory difficulty   . Thyroid disease   . Vitamin D deficiency     Patient Active Problem List   Diagnosis Date Noted  . Increased endometrial stripe thickness   . Hypothyroidism   . Acute lower UTI 08/11/2016  . Hyponatremia 08/11/2016  . Liver mass 08/11/2016  . Nausea and vomiting 08/10/2016    Past Surgical History:  Procedure Laterality Date  . BREAST BIOPSY Left   . CATARACT EXTRACTION, BILATERAL    . GLAUCOMA SURGERY    . WRIST SURGERY Left     OB History    No data available       Home Medications    Prior to Admission medications   Medication Sig Start Date End Date Taking? Authorizing Provider  Cholecalciferol (VITAMIN D3) 2000 units capsule Take 2,000 Units by mouth daily.     [provider]  esomeprazole (NEXIUM) 20 MG capsule Take 20 mg by mouth daily at 12 noon.    [provider]  feeding supplement, ENSURE ENLIVE, (ENSURE ENLIVE) LIQD Take 237 mLs by mouth 2 (two) times daily between meals. 08/12/16   Barton Dubois, MD    levothyroxine (SYNTHROID, LEVOTHROID) 75 MCG tablet Take 75 mcg by mouth daily before breakfast.    [provider]  pravastatin (PRAVACHOL) 40 MG tablet Take 40 mg by mouth daily.  05/11/16   [provider]  sertraline (ZOLOFT) 50 MG tablet Take 50 mg by mouth daily. 02/24/16   [provider]    Family History Family History  Problem Relation Age of Onset  . CAD Father   . CAD Brother   . CVA Paternal Grandmother     Social History Social History  Substance Use Topics  . Smoking status: Never Smoker  . Smokeless tobacco: Never Used  . Alcohol use No     Allergies   Patient has no known allergies.   Review of Systems Review of Systems  Constitutional: Negative for chills and fever.  HENT: Negative for facial swelling and sore throat.   Respiratory: Negative for shortness of breath.   Cardiovascular: Negative for chest pain.  Gastrointestinal: Negative for abdominal pain, nausea and vomiting.  Genitourinary: Negative for dysuria.  Musculoskeletal: Positive for arthralgias. Negative for back pain and neck pain.  Skin: Positive for wound. Negative for rash.  Neurological: Negative for headaches.  Psychiatric/Behavioral: The patient is not nervous/anxious.      Physical Exam Updated Vital Signs BP (!) 111/56   Pulse 82   Temp 98.1  F (36.7 C) (Oral)   Resp 18   Ht 5\' 2"  (1.575 m)   Wt 59 kg (130 lb)   SpO2 100%   BMI 23.78 kg/m   Physical Exam  Constitutional: She appears well-developed and well-nourished. No distress.  HENT:  Head: Normocephalic and atraumatic.  Mouth/Throat: Oropharynx is clear and moist. No oropharyngeal exudate.  Eyes: Pupils are equal, round, and reactive to light. Conjunctivae and EOM are normal. Right eye exhibits no discharge. Left eye exhibits no discharge. No scleral icterus.  Minor abrasion above right eyebrow; edema, swelling, and tenderness over the right inferior orbital region  Neck: Normal range of  motion. Neck supple. No thyromegaly present.  Cardiovascular: Normal rate, regular rhythm, normal heart sounds and intact distal pulses.  Exam reveals no gallop and no friction rub.   No murmur heard. Pulmonary/Chest: Effort normal and breath sounds normal. No stridor. No respiratory distress. She has no wheezes. She has no rales.  Abdominal: Soft. Bowel sounds are normal. She exhibits no distension. There is no tenderness. There is no rebound and no guarding.  Musculoskeletal: She exhibits no edema.  No midline cervical, thoracic, or lumbar tenderness Ecchymosis and tenderness to anterior right knee; range of motion intact with pain; ecchymosis and tenderness to the right dorsal hand and wrist, no anatomical snuffbox tenderness; normal sensation; cap refill <2secs; radial pulses intact  Lymphadenopathy:    She has no cervical adenopathy.  Neurological: She is alert. Coordination normal.  CN 3-12 intact; normal sensation throughout; 5/5 strength in all 4 extremities; equal bilateral grip strength  Skin: Skin is warm and dry. No rash noted. She is not diaphoretic. No pallor.  Psychiatric: She has a normal mood and affect.  Nursing note and vitals reviewed.    ED Treatments / Results  Labs (all labs ordered are listed, but only abnormal results are displayed) Labs Reviewed - No data to display  EKG  EKG Interpretation None       Radiology Ct Head Wo Contrast  Result Date: 11/10/2016 CLINICAL DATA:  81 year old female status post fall 3 days previously EXAM: CT HEAD WITHOUT CONTRAST CT MAXILLOFACIAL WITHOUT CONTRAST CT CERVICAL SPINE WITHOUT CONTRAST TECHNIQUE: Multidetector CT imaging of the head, cervical spine, and maxillofacial structures were performed using the standard protocol without intravenous contrast. Multiplanar CT image reconstructions of the cervical spine and maxillofacial structures were also generated. COMPARISON:  Prior CT scan of the head 05/08/2016 FINDINGS: CT HEAD  FINDINGS Brain: No evidence of acute infarction, hemorrhage, hydrocephalus, extra-axial collection or mass lesion/mass effect. Central and cortical atrophy with mild ex vacuo dilatation of the lateral ventricles. Vascular: No hyperdense vessel or unexpected calcification. Skull: Normal. Negative for fracture or focal lesion. Other: None. CT MAXILLOFACIAL FINDINGS Osseous: No fracture or mandibular dislocation. No destructive process. Orbits: Negative. No traumatic or inflammatory finding. Sinuses: Clear. Soft tissues: Small contusion overlying the right maxillary antrum. CT CERVICAL SPINE FINDINGS Alignment: Normal. Skull base and vertebrae: No acute fracture. No primary bone lesion or focal pathologic process. Soft tissues and spinal canal: No prevertebral fluid or swelling. No visible canal hematoma. Disc levels: Mild focal degenerative disc disease at C5-C6 and C6-C7. Upper chest: Negative. Other: None. IMPRESSION: CT HEAD 1. No acute intracranial abnormality. 2. Stable atrophy and ex vacuo ventriculomegaly. CT FACE 1. Soft tissue contusion overlying the right maxillary antrum without evidence of underlying fracture. CT CSPINE 1. No acute fracture or malalignment. Electronically Signed   By: Jacqulynn Cadet M.D.   On: 11/10/2016 13:30  Ct Cervical Spine Wo Contrast  Result Date: 11/10/2016 CLINICAL DATA:  81 year old female status post fall 3 days previously EXAM: CT HEAD WITHOUT CONTRAST CT MAXILLOFACIAL WITHOUT CONTRAST CT CERVICAL SPINE WITHOUT CONTRAST TECHNIQUE: Multidetector CT imaging of the head, cervical spine, and maxillofacial structures were performed using the standard protocol without intravenous contrast. Multiplanar CT image reconstructions of the cervical spine and maxillofacial structures were also generated. COMPARISON:  Prior CT scan of the head 05/08/2016 FINDINGS: CT HEAD FINDINGS Brain: No evidence of acute infarction, hemorrhage, hydrocephalus, extra-axial collection or mass  lesion/mass effect. Central and cortical atrophy with mild ex vacuo dilatation of the lateral ventricles. Vascular: No hyperdense vessel or unexpected calcification. Skull: Normal. Negative for fracture or focal lesion. Other: None. CT MAXILLOFACIAL FINDINGS Osseous: No fracture or mandibular dislocation. No destructive process. Orbits: Negative. No traumatic or inflammatory finding. Sinuses: Clear. Soft tissues: Small contusion overlying the right maxillary antrum. CT CERVICAL SPINE FINDINGS Alignment: Normal. Skull base and vertebrae: No acute fracture. No primary bone lesion or focal pathologic process. Soft tissues and spinal canal: No prevertebral fluid or swelling. No visible canal hematoma. Disc levels: Mild focal degenerative disc disease at C5-C6 and C6-C7. Upper chest: Negative. Other: None. IMPRESSION: CT HEAD 1. No acute intracranial abnormality. 2. Stable atrophy and ex vacuo ventriculomegaly. CT FACE 1. Soft tissue contusion overlying the right maxillary antrum without evidence of underlying fracture. CT CSPINE 1. No acute fracture or malalignment. Electronically Signed   By: Jacqulynn Cadet M.D.   On: 11/10/2016 13:30   Dg Knee Complete 4 Views Right  Result Date: 11/10/2016 CLINICAL DATA:  81 year old female status post fall several days ago EXAM: RIGHT KNEE - COMPLETE 4+ VIEW COMPARISON:  None. FINDINGS: No evidence of fracture, dislocation, or joint effusion. No evidence of arthropathy or other focal bone abnormality. Soft tissues are unremarkable. IMPRESSION: Negative. Electronically Signed   By: Jacqulynn Cadet M.D.   On: 11/10/2016 13:20   Dg Hand Complete Right  Result Date: 11/10/2016 CLINICAL DATA:  Fall yesterday.  Right hand pain and bruising. EXAM: RIGHT HAND - COMPLETE 3+ VIEW COMPARISON:  None. FINDINGS: There is no evidence of fracture or dislocation. Generalized osteopenia. Moderate severe osteoarthritis is seen involving the interphalangeal joints of all digits, the fourth  metacarpophalangeal joint, and the STT joint complex in the wrist. IMPRESSION: No acute findings. Osteoarthritis. Electronically Signed   By: Earle Gell M.D.   On: 11/10/2016 13:22   Ct Maxillofacial Wo Contrast  Result Date: 11/10/2016 CLINICAL DATA:  81 year old female status post fall 3 days previously EXAM: CT HEAD WITHOUT CONTRAST CT MAXILLOFACIAL WITHOUT CONTRAST CT CERVICAL SPINE WITHOUT CONTRAST TECHNIQUE: Multidetector CT imaging of the head, cervical spine, and maxillofacial structures were performed using the standard protocol without intravenous contrast. Multiplanar CT image reconstructions of the cervical spine and maxillofacial structures were also generated. COMPARISON:  Prior CT scan of the head 05/08/2016 FINDINGS: CT HEAD FINDINGS Brain: No evidence of acute infarction, hemorrhage, hydrocephalus, extra-axial collection or mass lesion/mass effect. Central and cortical atrophy with mild ex vacuo dilatation of the lateral ventricles. Vascular: No hyperdense vessel or unexpected calcification. Skull: Normal. Negative for fracture or focal lesion. Other: None. CT MAXILLOFACIAL FINDINGS Osseous: No fracture or mandibular dislocation. No destructive process. Orbits: Negative. No traumatic or inflammatory finding. Sinuses: Clear. Soft tissues: Small contusion overlying the right maxillary antrum. CT CERVICAL SPINE FINDINGS Alignment: Normal. Skull base and vertebrae: No acute fracture. No primary bone lesion or focal pathologic process. Soft tissues and spinal  canal: No prevertebral fluid or swelling. No visible canal hematoma. Disc levels: Mild focal degenerative disc disease at C5-C6 and C6-C7. Upper chest: Negative. Other: None. IMPRESSION: CT HEAD 1. No acute intracranial abnormality. 2. Stable atrophy and ex vacuo ventriculomegaly. CT FACE 1. Soft tissue contusion overlying the right maxillary antrum without evidence of underlying fracture. CT CSPINE 1. No acute fracture or malalignment.  Electronically Signed   By: Jacqulynn Cadet M.D.   On: 11/10/2016 13:30    Procedures Procedures (including critical care time)  Medications Ordered in ED Medications - No data to display   Initial Impression / Assessment and Plan / ED Course  I have reviewed the triage vital signs and the nursing notes.  Pertinent labs & imaging results that were available during my care of the patient were reviewed by me and considered in my medical decision making (see chart for details).     Patient x-rays negative for acute findings in the R knee and hand/wrist. CT head, C-spine, maxillofacial negative for acute fracture or intracranial injuries. Supportive treatment discussed including antibiotic ointment on abrasions, ice, and Tylenol as needed for pain. Follow-up to PCP this week for recheck. Return precautions discussed. Patient and family understand and agree with plan. Patient vitals stable throughout ED course and discharged in satisfactory condition. I discussed patient case with Dr. Tomi Bamberger who guided the patient's management and agrees with plan.   Final Clinical Impressions(s) / ED Diagnoses   Final diagnoses:  Fall, initial encounter  Abrasion  Minor head injury, initial encounter    New Prescriptions Discharge Medication List as of 11/10/2016  2:08 PM       Frederica Kuster, PA-C 11/10/16 1540    Dorie Rank, MD 11/11/16 [phone]

## 2016-11-10 NOTE — ED Notes (Signed)
Patient transported to X-ray 

## 2016-11-10 NOTE — ED Triage Notes (Addendum)
Pt c/o fall from standing  X 3 days ago , right wrist pain and sm lac above rigt eye

## 2016-11-10 NOTE — Discharge Instructions (Signed)
Take Tylenol as prescribed over-the-counter as needed for your pain. Use ice to your wrist and knee, as well as cheek, 3-4 times daily alternating 20 minutes on, 20 minutes off. Apply antibiotic ointment to your abrasion above the eye once daily. Please see your doctor next week for recheck of your symptoms. Please return to the emergency department if you develop any new or worsening symptoms.

## 2016-11-15 DIAGNOSIS — W19XXXD Unspecified fall, subsequent encounter: Secondary | ICD-10-CM | POA: Diagnosis not present

## 2016-11-15 DIAGNOSIS — F0390 Unspecified dementia without behavioral disturbance: Secondary | ICD-10-CM | POA: Diagnosis not present

## 2016-11-15 DIAGNOSIS — S60211D Contusion of right wrist, subsequent encounter: Secondary | ICD-10-CM | POA: Diagnosis not present

## 2016-11-15 DIAGNOSIS — S8001XD Contusion of right knee, subsequent encounter: Secondary | ICD-10-CM | POA: Diagnosis not present

## 2016-11-15 DIAGNOSIS — S0003XD Contusion of scalp, subsequent encounter: Secondary | ICD-10-CM | POA: Diagnosis not present

## 2016-12-25 DIAGNOSIS — F0390 Unspecified dementia without behavioral disturbance: Secondary | ICD-10-CM | POA: Diagnosis not present

## 2016-12-25 DIAGNOSIS — R404 Transient alteration of awareness: Secondary | ICD-10-CM | POA: Diagnosis not present

## 2016-12-25 DIAGNOSIS — Z23 Encounter for immunization: Secondary | ICD-10-CM | POA: Diagnosis not present

## 2016-12-25 DIAGNOSIS — K219 Gastro-esophageal reflux disease without esophagitis: Secondary | ICD-10-CM | POA: Diagnosis not present

## 2016-12-25 DIAGNOSIS — E441 Mild protein-calorie malnutrition: Secondary | ICD-10-CM | POA: Diagnosis not present

## 2016-12-25 DIAGNOSIS — F329 Major depressive disorder, single episode, unspecified: Secondary | ICD-10-CM | POA: Diagnosis not present

## 2016-12-25 DIAGNOSIS — E785 Hyperlipidemia, unspecified: Secondary | ICD-10-CM | POA: Diagnosis not present

## 2016-12-26 ENCOUNTER — Ambulatory Visit (INDEPENDENT_AMBULATORY_CARE_PROVIDER_SITE_OTHER): Payer: Medicare HMO | Admitting: Podiatry

## 2016-12-26 DIAGNOSIS — B351 Tinea unguium: Secondary | ICD-10-CM

## 2016-12-26 DIAGNOSIS — M79671 Pain in right foot: Secondary | ICD-10-CM | POA: Diagnosis not present

## 2016-12-26 DIAGNOSIS — M2042 Other hammer toe(s) (acquired), left foot: Secondary | ICD-10-CM | POA: Diagnosis not present

## 2016-12-26 DIAGNOSIS — M2041 Other hammer toe(s) (acquired), right foot: Secondary | ICD-10-CM | POA: Diagnosis not present

## 2016-12-26 DIAGNOSIS — M79672 Pain in left foot: Secondary | ICD-10-CM

## 2016-12-26 NOTE — Patient Instructions (Signed)
Seen for hypertrophic nails and corns. All nails, corns, and calluses debrided. Return in 3 months or as needed.

## 2016-12-26 NOTE — Progress Notes (Signed)
SUBJECTIVE: 81 y.o.year old femalepresents complaining of painful corns, calluses and toe nails. Patient request them be trimmed.  Patient is a hard of hearing and accompanied by her sister.  Denies any new problems.  OBJECTIVE: DERMATOLOGIC EXAMINATION: Thick dystrophic nails x 10. Painful corn 2nd toe right, pinch callus under the first MPJ bilateral. VASCULAR EXAMINATION OF LOWER LIMBS: All pedal pulses are palpable with normal pulsation.  NEUROLOGIC EXAMINATION OF THE LOWER LIMBS: All epicritic and tactile sensations grossly intact.  MUSCULOSKELETAL EXAMINATION: Positive for severe Valgus deformity of both great toe sitting under the 2nd toe bilateral. Medially deviated 2nd toe with dorsal digital corn over PIPJ due to under riding hallux.   ASSESSMENT: Subluxed 1st and 2nd MPJ bilateral. Severe HAV with bunion deformity bilateral. Severe Hammer toe deformity with digital corns. Mycotic nails x 10. Pain in both feet.  PLAN: Reviewed clinical findings and available treatment options.  All lesions debrided. 2nd toe right padded. All nails debrided. Return in 3 moths.

## 2016-12-27 ENCOUNTER — Encounter: Payer: Self-pay | Admitting: Podiatry

## 2017-01-16 DIAGNOSIS — N8500 Endometrial hyperplasia, unspecified: Secondary | ICD-10-CM | POA: Diagnosis not present

## 2017-01-28 ENCOUNTER — Other Ambulatory Visit: Payer: Self-pay | Admitting: Obstetrics & Gynecology

## 2017-02-07 ENCOUNTER — Encounter (HOSPITAL_BASED_OUTPATIENT_CLINIC_OR_DEPARTMENT_OTHER): Payer: Self-pay | Admitting: *Deleted

## 2017-02-07 NOTE — Progress Notes (Signed)
NPO AFTER MIDNIGHT ARRIVE 1030AM [facility] SISTER POA EVELYM MICKEY POA TO BRING COPY OF POA TAKE NEXIUM, LEVOTHYROXINE, SERTRALINE, SIP OF WATER IN AM SISTER DRIVER.CHEST XRAY 08-10-16 EPIC/CHART, EKG 08-10-16 EPIC/CHART

## 2017-02-11 ENCOUNTER — Encounter (INDEPENDENT_AMBULATORY_CARE_PROVIDER_SITE_OTHER): Payer: Self-pay

## 2017-02-11 ENCOUNTER — Encounter (HOSPITAL_COMMUNITY)
Admission: RE | Admit: 2017-02-11 | Discharge: 2017-02-11 | Disposition: A | Discharge status: Home / Self Care | Payer: Medicare HMO | Source: Ambulatory Visit | Attending: Obstetrics & Gynecology | Admitting: Obstetrics & Gynecology

## 2017-02-11 DIAGNOSIS — N183 Chronic kidney disease, stage 3 (moderate): Secondary | ICD-10-CM | POA: Diagnosis not present

## 2017-02-11 DIAGNOSIS — E559 Vitamin D deficiency, unspecified: Secondary | ICD-10-CM | POA: Diagnosis not present

## 2017-02-11 DIAGNOSIS — R9389 Abnormal findings on diagnostic imaging of other specified body structures: Secondary | ICD-10-CM | POA: Diagnosis not present

## 2017-02-11 DIAGNOSIS — K219 Gastro-esophageal reflux disease without esophagitis: Secondary | ICD-10-CM | POA: Diagnosis not present

## 2017-02-11 DIAGNOSIS — E785 Hyperlipidemia, unspecified: Secondary | ICD-10-CM | POA: Diagnosis not present

## 2017-02-11 DIAGNOSIS — Z79899 Other long term (current) drug therapy: Secondary | ICD-10-CM | POA: Diagnosis not present

## 2017-02-11 DIAGNOSIS — F329 Major depressive disorder, single episode, unspecified: Secondary | ICD-10-CM | POA: Diagnosis not present

## 2017-02-11 DIAGNOSIS — Z8249 Family history of ischemic heart disease and other diseases of the circulatory system: Secondary | ICD-10-CM | POA: Diagnosis not present

## 2017-02-11 DIAGNOSIS — N952 Postmenopausal atrophic vaginitis: Secondary | ICD-10-CM | POA: Diagnosis not present

## 2017-02-11 DIAGNOSIS — E039 Hypothyroidism, unspecified: Secondary | ICD-10-CM | POA: Diagnosis not present

## 2017-02-11 DIAGNOSIS — N882 Stricture and stenosis of cervix uteri: Secondary | ICD-10-CM | POA: Diagnosis not present

## 2017-02-11 DIAGNOSIS — F419 Anxiety disorder, unspecified: Secondary | ICD-10-CM | POA: Diagnosis not present

## 2017-02-11 LAB — CBC
HCT: 36.3 % (ref 36.0–46.0)
Hemoglobin: 12.5 g/dL (ref 12.0–15.0)
MCH: 30.9 pg (ref 26.0–34.0)
MCHC: 34.4 g/dL (ref 30.0–36.0)
MCV: 89.6 fL (ref 78.0–100.0)
PLATELETS: 275 10*3/uL (ref 150–400)
RBC: 4.05 MIL/uL (ref 3.87–5.11)
RDW: 12.5 % (ref 11.5–15.5)
WBC: 5.1 10*3/uL (ref 4.0–10.5)

## 2017-02-14 ENCOUNTER — Encounter (HOSPITAL_BASED_OUTPATIENT_CLINIC_OR_DEPARTMENT_OTHER)
Admission: RE | Disposition: A | Discharge status: Home / Self Care | Payer: Self-pay | Source: Ambulatory Visit | Attending: Obstetrics & Gynecology

## 2017-02-14 ENCOUNTER — Ambulatory Visit (HOSPITAL_BASED_OUTPATIENT_CLINIC_OR_DEPARTMENT_OTHER): Payer: Medicare HMO | Admitting: Anesthesiology

## 2017-02-14 ENCOUNTER — Ambulatory Visit (HOSPITAL_BASED_OUTPATIENT_CLINIC_OR_DEPARTMENT_OTHER)
Admission: RE | Admit: 2017-02-14 | Discharge: 2017-02-14 | Disposition: A | Discharge status: Home / Self Care | Payer: Medicare HMO | Source: Ambulatory Visit | Attending: Obstetrics & Gynecology | Admitting: Obstetrics & Gynecology

## 2017-02-14 ENCOUNTER — Other Ambulatory Visit: Payer: Self-pay

## 2017-02-14 ENCOUNTER — Encounter (HOSPITAL_BASED_OUTPATIENT_CLINIC_OR_DEPARTMENT_OTHER): Payer: Self-pay | Admitting: *Deleted

## 2017-02-14 DIAGNOSIS — R9389 Abnormal findings on diagnostic imaging of other specified body structures: Secondary | ICD-10-CM | POA: Insufficient documentation

## 2017-02-14 DIAGNOSIS — F329 Major depressive disorder, single episode, unspecified: Secondary | ICD-10-CM | POA: Insufficient documentation

## 2017-02-14 DIAGNOSIS — N952 Postmenopausal atrophic vaginitis: Secondary | ICD-10-CM | POA: Insufficient documentation

## 2017-02-14 DIAGNOSIS — N183 Chronic kidney disease, stage 3 (moderate): Secondary | ICD-10-CM | POA: Insufficient documentation

## 2017-02-14 DIAGNOSIS — N8500 Endometrial hyperplasia, unspecified: Secondary | ICD-10-CM | POA: Diagnosis not present

## 2017-02-14 DIAGNOSIS — E785 Hyperlipidemia, unspecified: Secondary | ICD-10-CM | POA: Diagnosis not present

## 2017-02-14 DIAGNOSIS — K219 Gastro-esophageal reflux disease without esophagitis: Secondary | ICD-10-CM | POA: Diagnosis not present

## 2017-02-14 DIAGNOSIS — E559 Vitamin D deficiency, unspecified: Secondary | ICD-10-CM | POA: Insufficient documentation

## 2017-02-14 DIAGNOSIS — Z79899 Other long term (current) drug therapy: Secondary | ICD-10-CM | POA: Insufficient documentation

## 2017-02-14 DIAGNOSIS — F419 Anxiety disorder, unspecified: Secondary | ICD-10-CM | POA: Diagnosis not present

## 2017-02-14 DIAGNOSIS — N882 Stricture and stenosis of cervix uteri: Secondary | ICD-10-CM | POA: Diagnosis not present

## 2017-02-14 DIAGNOSIS — E039 Hypothyroidism, unspecified: Secondary | ICD-10-CM | POA: Diagnosis not present

## 2017-02-14 DIAGNOSIS — Z8249 Family history of ischemic heart disease and other diseases of the circulatory system: Secondary | ICD-10-CM | POA: Insufficient documentation

## 2017-02-14 HISTORY — PX: HYSTEROSCOPY W/D&C: SHX1775

## 2017-02-14 SURGERY — EXAM UNDER ANESTHESIA
Anesthesia: Choice

## 2017-02-14 MED ORDER — PROPOFOL 10 MG/ML IV BOLUS
INTRAVENOUS | Status: DC | PRN
  Administered 2017-02-14: 100 mg via INTRAVENOUS

## 2017-02-14 MED ORDER — IBUPROFEN 800 MG PO TABS: 800.0000 mg | ORAL_TABLET | Freq: Three times a day (TID) | ORAL | 0 refills | Status: DC | PRN

## 2017-02-14 MED ORDER — ONDANSETRON HCL 4 MG/2ML IJ SOLN
INTRAMUSCULAR | Status: DC | PRN
  Administered 2017-02-14: 4 mg via INTRAVENOUS

## 2017-02-14 MED ORDER — PROMETHAZINE HCL 25 MG/ML IJ SOLN
6.2500 mg | INTRAMUSCULAR | Status: DC | PRN
  Filled 2017-02-14: qty 1

## 2017-02-14 MED ORDER — FENTANYL CITRATE (PF) 100 MCG/2ML IJ SOLN
INTRAMUSCULAR | Status: DC | PRN
  Administered 2017-02-14: 25 ug via INTRAVENOUS

## 2017-02-14 MED ORDER — LIDOCAINE 2% (20 MG/ML) 5 ML SYRINGE
INTRAMUSCULAR | Status: DC | PRN
  Administered 2017-02-14: 60 mg via INTRAVENOUS

## 2017-02-14 MED ORDER — DEXAMETHASONE SODIUM PHOSPHATE 10 MG/ML IJ SOLN
INTRAMUSCULAR | Status: DC | PRN
  Administered 2017-02-14: 10 mg via INTRAVENOUS

## 2017-02-14 MED ORDER — LIDOCAINE 5 % EX OINT
TOPICAL_OINTMENT | CUTANEOUS | Status: DC | PRN
  Administered 2017-02-14: 1

## 2017-02-14 MED ORDER — FENTANYL CITRATE (PF) 100 MCG/2ML IJ SOLN
INTRAMUSCULAR | Status: AC
  Filled 2017-02-14: qty 2

## 2017-02-14 MED ORDER — MEPERIDINE HCL 25 MG/ML IJ SOLN
6.2500 mg | INTRAMUSCULAR | Status: DC | PRN
  Filled 2017-02-14: qty 1

## 2017-02-14 MED ORDER — ONDANSETRON HCL 4 MG/2ML IJ SOLN
INTRAMUSCULAR | Status: AC
  Filled 2017-02-14: qty 2

## 2017-02-14 MED ORDER — OXYCODONE HCL 5 MG PO TABS: 5.0000 mg | ORAL_TABLET | Freq: Four times a day (QID) | ORAL | 0 refills | Status: DC | PRN

## 2017-02-14 MED ORDER — SODIUM CHLORIDE 0.9 % IV SOLN
INTRAVENOUS | Status: DC
  Administered 2017-02-14: 11:00:00 via INTRAVENOUS
  Filled 2017-02-14: qty 1000

## 2017-02-14 MED ORDER — FENTANYL CITRATE (PF) 100 MCG/2ML IJ SOLN
25.0000 ug | INTRAMUSCULAR | Status: DC | PRN
  Filled 2017-02-14: qty 1

## 2017-02-14 MED ORDER — LIDOCAINE HCL 1 % IJ SOLN
INTRAMUSCULAR | Status: DC | PRN
  Administered 2017-02-14: 10 mL

## 2017-02-14 MED ORDER — LIDOCAINE 2% (20 MG/ML) 5 ML SYRINGE
INTRAMUSCULAR | Status: AC
Start: 2017-02-14 — End: 2017-02-14
  Filled 2017-02-14: qty 5

## 2017-02-14 MED ORDER — DEXAMETHASONE SODIUM PHOSPHATE 10 MG/ML IJ SOLN
INTRAMUSCULAR | Status: AC
  Filled 2017-02-14: qty 1

## 2017-02-14 MED ORDER — PROPOFOL 10 MG/ML IV BOLUS
INTRAVENOUS | Status: AC
  Filled 2017-02-14: qty 20

## 2017-02-14 SURGICAL SUPPLY — 17 items
BIPOLAR CUTTING LOOP 21FR (ELECTRODE)
CANISTER SUCT 3000ML PPV (MISCELLANEOUS) ×3 IMPLANT
CATH ROBINSON RED A/P 16FR (CATHETERS) IMPLANT
DILATOR CANAL MILEX (MISCELLANEOUS) IMPLANT
GLOVE BIO SURGEON STRL SZ 6.5 (GLOVE) ×4 IMPLANT
GLOVE BIO SURGEONS STRL SZ 6.5 (GLOVE) ×2
GOWN STRL REUS W/TWL LRG LVL3 (GOWN DISPOSABLE) ×3 IMPLANT
KIT RM TURNOVER CYSTO AR (KITS) ×3 IMPLANT
LOOP CUTTING BIPOLAR 21FR (ELECTRODE) IMPLANT
PACK VAGINAL MINOR WOMEN LF (CUSTOM PROCEDURE TRAY) ×3 IMPLANT
PAD OB MATERNITY 4.3X12.25 (PERSONAL CARE ITEMS) ×3 IMPLANT
SCOPETTES 8  STERILE (MISCELLANEOUS) ×4
SCOPETTES 8 STERILE (MISCELLANEOUS) IMPLANT
TOWEL OR 17X24 6PK STRL BLUE (TOWEL DISPOSABLE) ×6 IMPLANT
TUBING AQUILEX INFLOW (TUBING) ×3 IMPLANT
TUBING AQUILEX OUTFLOW (TUBING) ×3 IMPLANT
WATER STERILE IRR 500ML POUR (IV SOLUTION) ×3 IMPLANT

## 2017-02-14 NOTE — Anesthesia Postprocedure Evaluation (Signed)
Anesthesia Post Note  Patient: Jessica [name]  Procedure(s) Performed: EXAM UNDER ANESTHESIA (N/A ) DILATATION AND CURETTAGE /HYSTEROSCOPY (N/A )     Patient location during evaluation: PACU Anesthesia Type: General Level of consciousness: awake and alert and oriented Pain management: pain level controlled Vital Signs Assessment: post-procedure vital signs reviewed and stable Respiratory status: spontaneous breathing, nonlabored ventilation and respiratory function stable Cardiovascular status: blood pressure returned to baseline and stable Postop Assessment: no apparent nausea or vomiting Anesthetic complications: no    Last Vitals:  Vitals:   02/14/17 1345 02/14/17 1400  BP: (!) 149/70 (!) 151/69  Pulse: 67 65  Resp: 17 15  Temp:    SpO2: 99% 98%    Last Pain:  Vitals:   02/14/17 1015  TempSrc: Oral                 Andrew Blasius A.

## 2017-02-14 NOTE — Discharge Instructions (Signed)
  Post Anesthesia Home Care Instructions  Activity: Get plenty of rest for the remainder of the day. A responsible individual must stay with you for 24 hours following the procedure.  For the next 24 hours, DO NOT: -Drive a car -Operate machinery -Drink alcoholic beverages -Take any medication unless instructed by your physician -Make any legal decisions or sign important papers.  Meals: Start with liquid foods such as gelatin or soup. Progress to regular foods as tolerated. Avoid greasy, spicy, heavy foods. If nausea and/or vomiting occur, drink only clear liquids until the nausea and/or vomiting subsides. Call your physician if vomiting continues.  Special Instructions/Symptoms: Your throat may feel dry or sore from the anesthesia or the breathing tube placed in your throat during surgery. If this causes discomfort, gargle with warm salt water. The discomfort should disappear within 24 hours.  If you had a scopolamine patch placed behind your ear for the management of post- operative nausea and/or vomiting:  1. The medication in the patch is effective for 72 hours, after which it should be removed.  Wrap patch in a tissue and discard in the trash. Wash hands thoroughly with soap and water. 2. You may remove the patch earlier than 72 hours if you experience unpleasant side effects which may include dry mouth, dizziness or visual disturbances. 3. Avoid touching the patch. Wash your hands with soap and water after contact with the patch.      D & C Home care Instructions:   Personal hygiene:  Used sanitary napkins for vaginal drainage not tampons. Shower or tub bathe the day after your procedure. No douching until bleeding stops. Always wipe from front to back after  Elimination.  Activity: Do not drive or operate any equipment today. The effects of the anesthesia are still present and drowsiness may result. Rest today, not necessarily flat bed rest, just take it easy. You may resume your  normal activity in one to 2 days.  Sexual activity: No intercourse for one week or as indicated by your physician  Diet: Eat a light diet as desired this evening. You may resume a regular diet tomorrow.  Return to work: One to 2 days.  General Expectations of your surgery: Vaginal bleeding should be no heavier than a normal period. Spotting may continue up to 10 days. Mild cramps may continue for a couple of days. You may have a regular period in 2-6 weeks.  Unexpected observations call your doctor if these occur: persistent or heavy bleeding. Severe abdominal cramping or pain. Elevation of temperature greater than 100F.  Call for an appointment in one week.   

## 2017-02-14 NOTE — Transfer of Care (Signed)
Immediate Anesthesia Transfer of Care Note  Patient: Jessica [name]  Procedure(s) Performed: EXAM UNDER ANESTHESIA (N/A ) DILATATION AND CURETTAGE /HYSTEROSCOPY (N/A )  Patient Location: PACU  Anesthesia Type:General  Level of Consciousness: drowsy  Airway & Oxygen Therapy: Patient Spontanous Breathing and Patient connected to nasal cannula oxygen  Post-op Assessment: Report given to RN  Post vital signs: Reviewed and stable  Last Vitals:  Vitals:   02/14/17 1015 02/14/17 1253  BP: (!) 127/55 (!) 144/73  Pulse: 63 75  Resp: 16 (!) 5  Temp: 36.5 C   SpO2: 100% 100%    Last Pain:  Vitals:   02/14/17 1015  TempSrc: Oral      Patients Stated Pain Goal: 5 (92/11/94 1740)  Complications: No apparent anesthesia complications

## 2017-02-14 NOTE — Anesthesia Procedure Notes (Signed)
Procedure Name: LMA Insertion Date/Time: 02/14/2017 12:12 PM Performed by: Bonney Aid, CRNA Pre-anesthesia Checklist: Patient identified, Emergency Drugs available, Suction available and Patient being monitored Patient Re-evaluated:Patient Re-evaluated prior to induction Oxygen Delivery Method: Circle system utilized Preoxygenation: Pre-oxygenation with 100% oxygen Induction Type: IV induction Ventilation: Mask ventilation without difficulty LMA: LMA inserted LMA Size: 4.0 Number of attempts: 1 Airway Equipment and Method: Bite block Placement Confirmation: positive ETCO2 Tube secured with: Tape Dental Injury: Teeth and Oropharynx as per pre-operative assessment

## 2017-02-14 NOTE — Anesthesia Preprocedure Evaluation (Signed)
Anesthesia Evaluation  Patient identified by MRN, date of birth, ID band Patient awake    Reviewed: Allergy & Precautions, NPO status , Patient's Chart, lab work & pertinent test results  Airway Mallampati: II  TM Distance: >3 FB Neck ROM: Full    Dental no notable dental hx.    Pulmonary neg pulmonary ROS,    Pulmonary exam normal breath sounds clear to auscultation       Cardiovascular negative cardio ROS Normal cardiovascular exam Rhythm:Regular Rate:Normal     Neuro/Psych negative neurological ROS  negative psych ROS   GI/Hepatic negative GI ROS, Neg liver ROS,   Endo/Other  Hypothyroidism   Renal/GU Renal disease     Musculoskeletal negative musculoskeletal ROS (+)   Abdominal   Peds  Hematology negative hematology ROS (+)   Anesthesia Other Findings   Reproductive/Obstetrics negative OB ROS                             Anesthesia Physical Anesthesia Plan  ASA: III  Anesthesia Plan:    Post-op Pain Management:    Induction: Intravenous  PONV Risk Score and Plan: 1  Airway Management Planned:   Additional Equipment:   Intra-op Plan:   Post-operative Plan: Extubation in OR  Informed Consent: I have reviewed the patients History and Physical, chart, labs and discussed the procedure including the risks, benefits and alternatives for the proposed anesthesia with the patient or authorized representative who has indicated his/her understanding and acceptance.   Dental advisory given  Plan Discussed with: CRNA  Anesthesia Plan Comments:         Anesthesia Quick Evaluation

## 2017-02-14 NOTE — H&P (Signed)
Jessica [name] is an 81 y.o. female was a referral to me by her PCP for evaluation of a thickened endometrium found incidentally on pelvic US. Patient was evaluated for bloating, intermittent abdominal pain and nausea. She denies any h/o of post menopausal bleeding. Pt denies any issues or complaints at this time/AE. The repeat US in the office showed a thickened endometrium, with hypervascularity.   Pertinent Gynecological History: Menses: post-menopausal Bleeding: post menopausal bleeding Contraception: none DES exposure: unknown Blood transfusions: none Sexually transmitted diseases: no past history Previous GYN Procedures: none  Last mammogram: normal Date: 2017 Last pap: n.a  OB History: G0, P0   Menstrual History: Menarche age: 36 No LMP recorded. Patient is postmenopausal.    Past Medical History:  Diagnosis Date  . Anxiety   . CKD (chronic kidney disease), stage III ([place])   . GERD without esophagitis   . Hyperlipemia   . Major depressive disorder, single episode, unspecified   . Memory difficulty   . Thyroid disease   . Vitamin D deficiency     Past Surgical History:  Procedure Laterality Date  . BREAST BIOPSY Left   . CATARACT EXTRACTION, BILATERAL    . GLAUCOMA SURGERY    . LIVER BIOPSY  2018  . WRIST SURGERY Left     Family History  Problem Relation Age of Onset  . CAD Father   . CAD Brother   . CVA Paternal Grandmother     Social History:  reports that  has never smoked. she has never used smokeless tobacco. She reports that she does not drink alcohol or use drugs.  Allergies: No Known Allergies  Medications Prior to Admission  Medication Sig Dispense Refill Last Dose  . Cholecalciferol (VITAMIN D3) 2000 units capsule Take 2,000 Units by mouth daily.    02/13/2017 at Unknown time  . esomeprazole (NEXIUM) 40 MG capsule Take 40 mg by mouth every morning.   02/13/2017 at Unknown time  . feeding supplement, ENSURE ENLIVE, (ENSURE ENLIVE) LIQD Take 237  mLs by mouth 2 (two) times daily between meals.   Past Week at Unknown time  . levothyroxine (SYNTHROID, LEVOTHROID) 75 MCG tablet Take 75 mcg by mouth daily before breakfast.   02/14/2017 at Unknown time  . pravastatin (PRAVACHOL) 40 MG tablet Take 40 mg by mouth every evening.    02/13/2017 at Unknown time  . sertraline (ZOLOFT) 50 MG tablet Take 50 mg by mouth daily.   02/14/2017 at 0615  . esomeprazole (NEXIUM) 20 MG capsule Take 20 mg by mouth daily at 12 noon.   10/07/2016 at Unknown time    Review of Systems  Respiratory: Positive for hemoptysis.     Blood pressure (!) 127/55, pulse 63, temperature 97.7 F (36.5 C), temperature source Oral, resp. rate 16, height 5\' 2"  (1.575 m), weight 57.4 kg (126 lb 8 oz), SpO2 100 %. Physical Exam In office: Examination: limited by anxiety  Vulva: no erythema, no excoriation, no discoloration, no lesions, no vesicles, no masses, no swelling, atrophy, tenderness  Mons: normal, no erythema, no excoriation, no lesions, no vesicles/ ulcers, no masses, no swelling, no tenderness  Labia Majora: no erythema, no excoriation, no discoloration, no lesions, no vesicles/ ulcers, no masses, no swelling, no tenderness, atrophy  Labia Minora: no erythema, no excoriation, no discoloration, no lesions, no vesicles, no masses, no swelling, no tenderness, atrophy  Introitus: narrowed/contracted, scarring, tenderness  Bartholin's Gland: normal  Vagina: no discharge, no blood present, no erythema, no lesions, no ulcers,  no swelling, no masses, no tenderness, no prolapse, atrophy moderate, vaginal tone increased, pelvic floor muscles tender bilateral  Uterus: normal size, normal contour, midline, no uterine prolapse, mobile, non-tender  Urethral Meatus/ Urethra normal meatus, no discharge, well supported urethra, no masses, no tenderness  Bladder: non-distended, no palpable mass, non-tender  Adnexa/Parametria: (unable to palpate due to patient discomfort)   Difficult  examination due to patient anxiety -  She would not tolerate placement of a pediatric pederson speculum  No results found for this or any previous visit (from the past 24 hour(s)).  No results found.  Assessment/Plan: 81 year old with thickened endometrium  will perform D&C and diagnostic hysteroscopy Risks discussed previously with patient  Caffie Damme 02/14/2017, 11:31 AM

## 2017-02-14 NOTE — Op Note (Signed)
OPERATIVE REPORT  Surgeon: Caffie Damme   Assistants: None  Pre-operative Diagnosis: Post menopausal thickened hypervascular endometrium  Post-operative Diagnosis: same    Procedure  Exam under anesthesia Dilatation and curettage Diagnostic hysteroscopy  Anesthesia: General mask inhalational anesthesia  ASA Class: 2  EBL: 5 mL  UOP: n/a  IVF: 300 mL  Specimen: Endometrial curettings  Findings: External vulva: severe atrophy vagina: stenotic / atrophic Cervix: stenotic flush with vagina, nulliparous, normal.  Hysteroscopy: Normal endometrium, mild tissue hypertrophy, not suspicious.   Complications: None  After adequate anesthesia was achieved, the patient was prepped and draped in the usual sterile fashion.  A small pediatric pederson speculum was placed in the vagina and the cervix stabilized with a  Allis clamp. A paracervical block was performed with 10 mL of 1% plain lidocaine. Dilated the cervix first with lacrimal duct dilators then with Hank dilators to 12 Fr.  The 5 mm 0 degree hysteroscope was passed inside the endometrial cavity; Normal saline was used as a distending medium. The above findings were noted and sharp curettage was then performed and uterine curettings sent to pathology.  All instruments were removed from the vagina. Counts were correct.  The patient tolerated the procedure well and was transferred to the recovery room in good condition.  Laelle Bridgett STACIA

## 2017-02-15 ENCOUNTER — Encounter (HOSPITAL_BASED_OUTPATIENT_CLINIC_OR_DEPARTMENT_OTHER): Payer: Self-pay | Admitting: Obstetrics & Gynecology

## 2017-03-06 DIAGNOSIS — Z961 Presence of intraocular lens: Secondary | ICD-10-CM | POA: Diagnosis not present

## 2017-03-06 DIAGNOSIS — H52203 Unspecified astigmatism, bilateral: Secondary | ICD-10-CM | POA: Diagnosis not present

## 2017-03-06 DIAGNOSIS — H4322 Crystalline deposits in vitreous body, left eye: Secondary | ICD-10-CM | POA: Diagnosis not present

## 2017-03-06 DIAGNOSIS — H43813 Vitreous degeneration, bilateral: Secondary | ICD-10-CM | POA: Diagnosis not present

## 2017-03-20 ENCOUNTER — Ambulatory Visit: Payer: Medicare HMO | Admitting: Neurology

## 2017-03-27 ENCOUNTER — Ambulatory Visit: Payer: Medicare HMO | Admitting: Podiatry

## 2017-03-27 ENCOUNTER — Encounter: Payer: Self-pay | Admitting: Podiatry

## 2017-03-27 DIAGNOSIS — M79671 Pain in right foot: Secondary | ICD-10-CM | POA: Diagnosis not present

## 2017-03-27 DIAGNOSIS — B351 Tinea unguium: Secondary | ICD-10-CM

## 2017-03-27 DIAGNOSIS — M79672 Pain in left foot: Secondary | ICD-10-CM

## 2017-03-27 NOTE — Progress Notes (Signed)
Subjective: 81 y.o. year old female patient presents accompanied by her younger sister complaining of painful toes and nails. Patient requests toe nails, corns and calluses trimmed.    Objective: Dermatologic: Thick yellow deformed nails x 10. Painful digital corn 2nd right, plantar callus under the left great toe. Vascular: Pedal pulses are all palpable. Orthopedic: Severe Hallux valgus with overlapping 2nd toe sitting on top of the great toe bilateral. Subluxed first and 2nd MPJ bilateral. Neurologic: All epicritic and tactile sensations grossly intact.  Assessment: Dystrophic mycotic nails x 10. Painful digital corn 2nd right, 1st left. Severe digital deformities with hallux valgus and bunion bilateral.  Treatment: All mycotic nails, corns, calluses debrided.  Tube foam gauze placed over the 2nd toe right.  Return in 3 months or as needed.

## 2017-03-27 NOTE — Patient Instructions (Signed)
Seen for hypertrophic nails. All nails debrided. 2nd toe right placed with Tube foam gauze. Return in 3 months or as needed.

## 2017-06-25 ENCOUNTER — Ambulatory Visit: Payer: Medicare HMO | Admitting: Podiatry

## 2017-06-25 DIAGNOSIS — N183 Chronic kidney disease, stage 3 (moderate): Secondary | ICD-10-CM | POA: Diagnosis not present

## 2017-06-25 DIAGNOSIS — R404 Transient alteration of awareness: Secondary | ICD-10-CM | POA: Diagnosis not present

## 2017-06-25 DIAGNOSIS — E559 Vitamin D deficiency, unspecified: Secondary | ICD-10-CM | POA: Diagnosis not present

## 2017-06-25 DIAGNOSIS — E039 Hypothyroidism, unspecified: Secondary | ICD-10-CM | POA: Diagnosis not present

## 2017-06-25 DIAGNOSIS — E785 Hyperlipidemia, unspecified: Secondary | ICD-10-CM | POA: Diagnosis not present

## 2017-06-25 DIAGNOSIS — K219 Gastro-esophageal reflux disease without esophagitis: Secondary | ICD-10-CM | POA: Diagnosis not present

## 2017-06-25 DIAGNOSIS — F419 Anxiety disorder, unspecified: Secondary | ICD-10-CM | POA: Diagnosis not present

## 2017-06-25 DIAGNOSIS — F0390 Unspecified dementia without behavioral disturbance: Secondary | ICD-10-CM | POA: Diagnosis not present

## 2017-06-26 ENCOUNTER — Ambulatory Visit: Payer: Medicare HMO | Admitting: Podiatry

## 2017-06-26 ENCOUNTER — Encounter: Payer: Self-pay | Admitting: Podiatry

## 2017-06-26 DIAGNOSIS — M79671 Pain in right foot: Secondary | ICD-10-CM | POA: Diagnosis not present

## 2017-06-26 DIAGNOSIS — L600 Ingrowing nail: Secondary | ICD-10-CM

## 2017-06-26 DIAGNOSIS — B351 Tinea unguium: Secondary | ICD-10-CM

## 2017-06-26 DIAGNOSIS — M79672 Pain in left foot: Secondary | ICD-10-CM | POA: Diagnosis not present

## 2017-06-26 NOTE — Progress Notes (Signed)
Subjective: 82 y.o. year old female patient presents complaining of painful nails. Patient requests toe nails trimmed.  Patient is accompanied by her sister. Patient is hard of hearing.   Objective: Dermatologic: Thick yellow deformed nails x 10. Ingrown hallucal nail left and 2nd toe right. Vascular: Pedal pulses are all palpable. Orthopedic: Severe Hallux valgus deformity with overlapping 1st and 2nd toes bilateral. Subluxed first and 2nd MPJ bilateral. Neurologic: All epicritic and tactile sensations grossly intact.  Assessment: Dystrophic mycotic nails x 10. Ingrown nails. Severe digital deformities with subluxed 1st and 2nd MPJ bilateral.  Treatment: All mycotic nails and ingrown nails debrided.  Return in 3 months or as needed.

## 2017-06-26 NOTE — Patient Instructions (Signed)
Seen for hypertrophic nails and ingrown. All nails debrided. Return in 3 months or as needed.

## 2017-07-23 ENCOUNTER — Other Ambulatory Visit: Payer: Self-pay

## 2017-07-23 ENCOUNTER — Encounter (HOSPITAL_BASED_OUTPATIENT_CLINIC_OR_DEPARTMENT_OTHER): Payer: Self-pay | Admitting: Emergency Medicine

## 2017-07-23 ENCOUNTER — Emergency Department (HOSPITAL_BASED_OUTPATIENT_CLINIC_OR_DEPARTMENT_OTHER): Payer: Medicare HMO

## 2017-07-23 ENCOUNTER — Emergency Department (HOSPITAL_BASED_OUTPATIENT_CLINIC_OR_DEPARTMENT_OTHER)
Admission: EM | Admit: 2017-07-23 | Discharge: 2017-07-23 | Disposition: A | Discharge status: Home / Self Care | Payer: Medicare HMO | Attending: Emergency Medicine | Admitting: Emergency Medicine

## 2017-07-23 DIAGNOSIS — S52611G Displaced fracture of right ulna styloid process, subsequent encounter for closed fracture with delayed healing: Secondary | ICD-10-CM | POA: Diagnosis not present

## 2017-07-23 DIAGNOSIS — W19XXXA Unspecified fall, initial encounter: Secondary | ICD-10-CM | POA: Insufficient documentation

## 2017-07-23 DIAGNOSIS — Y93H1 Activity, digging, shoveling and raking: Secondary | ICD-10-CM | POA: Diagnosis not present

## 2017-07-23 DIAGNOSIS — S52611D Displaced fracture of right ulna styloid process, subsequent encounter for closed fracture with routine healing: Secondary | ICD-10-CM | POA: Diagnosis not present

## 2017-07-23 DIAGNOSIS — Y92007 Garden or yard of unspecified non-institutional (private) residence as the place of occurrence of the external cause: Secondary | ICD-10-CM | POA: Insufficient documentation

## 2017-07-23 DIAGNOSIS — S52501A Unspecified fracture of the lower end of right radius, initial encounter for closed fracture: Secondary | ICD-10-CM | POA: Diagnosis not present

## 2017-07-23 DIAGNOSIS — N183 Chronic kidney disease, stage 3 (moderate): Secondary | ICD-10-CM | POA: Insufficient documentation

## 2017-07-23 DIAGNOSIS — Z79899 Other long term (current) drug therapy: Secondary | ICD-10-CM | POA: Diagnosis not present

## 2017-07-23 DIAGNOSIS — S59911A Unspecified injury of right forearm, initial encounter: Secondary | ICD-10-CM | POA: Diagnosis present

## 2017-07-23 DIAGNOSIS — Y999 Unspecified external cause status: Secondary | ICD-10-CM | POA: Diagnosis not present

## 2017-07-23 DIAGNOSIS — S52351A Displaced comminuted fracture of shaft of radius, right arm, initial encounter for closed fracture: Secondary | ICD-10-CM | POA: Diagnosis not present

## 2017-07-23 DIAGNOSIS — E039 Hypothyroidism, unspecified: Secondary | ICD-10-CM | POA: Insufficient documentation

## 2017-07-23 MED ORDER — HYDROCODONE-ACETAMINOPHEN 5-325 MG PO TABS: ORAL_TABLET | ORAL | 0 refills | Status: DC

## 2017-07-23 MED FILL — HYDROCODON-APAP 5-325: 5-325 | 2 days supply | Qty: 6 | Fill #0

## 2017-07-23 NOTE — ED Provider Notes (Signed)
[facility] EMERGENCY DEPARTMENT Provider Note   CSN: [id number] Arrival date & time: 07/23/17  1422     History   Chief Complaint Chief Complaint  Patient presents with  . Fall    HPI Jessica [name] is a 82 y.o. female.  Patient presents with acute onset of right hand swelling and pain after a fall while in her yard raking.  She denies feeling dizzy or lightheaded, having chest pain or shortness of breath, or passing out at time of accident.  She was with a friend when this occurred.  Patient does not think that she hit her head or loss consciousness.  She had immediate pain in the right arm.  She denies any headache or neck pain.  No vision changes or confusion.  No right elbow or shoulder pain.  No hip pain reported.  She has been able to move her lower extremities.  Fire and EMS was called and patient was transported to the emergency department by ambulance.  No treatments prior to arrival. The onset of this condition was acute. The course is constant. Aggravating factors: movement and palpation. Alleviating factors: none.       Past Medical History:  Diagnosis Date  . Anxiety   . CKD (chronic kidney disease), stage III ([place])   . GERD without esophagitis   . Hyperlipemia   . Major depressive disorder, single episode, unspecified   . Memory difficulty   . Thyroid disease   . Vitamin D deficiency     Patient Active Problem List   Diagnosis Date Noted  . Increased endometrial stripe thickness   . Hypothyroidism   . Acute lower UTI 08/11/2016  . Hyponatremia 08/11/2016  . Liver mass 08/11/2016  . Nausea and vomiting 08/10/2016    Past Surgical History:  Procedure Laterality Date  . BREAST BIOPSY Left   . CATARACT EXTRACTION, BILATERAL    . GLAUCOMA SURGERY    . HYSTEROSCOPY W/D&C N/A 02/14/2017   Procedure: DILATATION AND CURETTAGE /HYSTEROSCOPY;  Surgeon: Sanjuana Kava, MD;  Location: [facility];  Service: Gynecology;  Laterality: N/A;    . LIVER BIOPSY  2018  . WRIST SURGERY Left      OB History   None      Home Medications    Prior to Admission medications   Medication Sig Start Date End Date Taking? Authorizing Provider  Cholecalciferol (VITAMIN D3) 2000 units capsule Take 2,000 Units by mouth daily.     [provider]  esomeprazole (NEXIUM) 20 MG capsule Take 20 mg by mouth daily at 12 noon.    [provider]  esomeprazole (NEXIUM) 40 MG capsule Take 40 mg by mouth every morning.    [provider]  feeding supplement, ENSURE ENLIVE, (ENSURE ENLIVE) LIQD Take 237 mLs by mouth 2 (two) times daily between meals. 08/12/16   Barton Dubois, MD  ibuprofen (ADVIL,MOTRIN) 800 MG tablet Take 1 tablet (800 mg total) every 8 (eight) hours as needed by mouth. 02/14/17   Sanjuana Kava, MD  levothyroxine (SYNTHROID, LEVOTHROID) 75 MCG tablet Take 75 mcg by mouth daily before breakfast.    [provider]  oxyCODONE (OXY IR/ROXICODONE) 5 MG immediate release tablet Take 1 tablet (5 mg total) every 6 (six) hours as needed by mouth for severe pain. 02/14/17   Sanjuana Kava, MD  pravastatin (PRAVACHOL) 40 MG tablet Take 40 mg by mouth every evening.  05/11/16   [provider]  sertraline (ZOLOFT) 50 MG tablet Take  50 mg by mouth daily. 02/24/16   [provider]    Family History Family History  Problem Relation Age of Onset  . CAD Father   . CAD Brother   . CVA Paternal Grandmother     Social History Social History   Tobacco Use  . Smoking status: Never Smoker  . Smokeless tobacco: Never Used  Substance Use Topics  . Alcohol use: No    Alcohol/week: 0.0 oz  . Drug use: No     Allergies   Patient has no known allergies.   Review of Systems Review of Systems  Constitutional: Negative for activity change and fatigue.  HENT: Negative for rhinorrhea.   Eyes: Negative for photophobia, pain and visual disturbance.  Respiratory: Negative for shortness of breath.    Cardiovascular: Negative for chest pain.  Gastrointestinal: Negative for nausea and vomiting.  Musculoskeletal: Positive for arthralgias. Negative for back pain, gait problem, joint swelling and neck pain.  Skin: Negative for wound.  Neurological: Negative for dizziness, weakness, light-headedness, numbness and headaches.  Psychiatric/Behavioral: Negative for confusion and decreased concentration.     Physical Exam Updated Vital Signs BP 123/88 (BP Location: Left Arm)   Pulse 64   Temp 98.1 F (36.7 C) (Oral)   Ht 5\' 2"  (1.575 m)   Wt 57.6 kg (127 lb)   SpO2 100%   BMI 23.23 kg/m   Physical Exam  Constitutional: She appears well-developed and well-nourished.  HENT:  Head: Normocephalic and atraumatic.  Mouth/Throat: Oropharynx is clear and moist.  Examination of the head reveals no signs of hematoma or ecchymosis, abrasions, or other signs of trauma.  Eyes: Pupils are equal, round, and reactive to light.  Neck: Normal range of motion. Neck supple.  Cardiovascular: Normal pulses. Exam reveals no decreased pulses.  Musculoskeletal: She exhibits tenderness. She exhibits no edema.       Right shoulder: Normal.       Left shoulder: Normal.       Right elbow: Normal.      Right wrist: She exhibits tenderness, bony tenderness and swelling.       Cervical back: Normal.       Right upper arm: Normal.       Right forearm: Normal.       Right hand: She exhibits normal range of motion. Normal sensation noted.  Neurological: She is alert. No sensory deficit.  Motor, sensation, and vascular distal to the injury is fully intact.   Skin: Skin is warm and dry.  Psychiatric: She has a normal mood and affect.  Nursing note and vitals reviewed.    ED Treatments / Results  Labs (all labs ordered are listed, but only abnormal results are displayed) Labs Reviewed - No data to display  EKG None  Radiology Dg Wrist Complete Right  Result Date: 07/23/2017 CLINICAL DATA:  Pain  following fall EXAM: RIGHT WRIST - COMPLETE 3+ VIEW COMPARISON:  Right hand November 10, 2016 FINDINGS: Frontal, oblique, lateral, and ulnar deviation scaphoid images were obtained. There is a comminuted fracture of the distal radial metaphysis with impaction at the fracture site. A fracture fragment extends medially into the radiocarpal joint. There is mild dorsal angulation distally. There is a small avulsion of the ulnar styloid with alignment essentially anatomic. There is a questionable nondisplaced avulsion of the radial styloid as well. Bones are osteoporotic. There is advanced osteoarthritis at the scaphotrapezial joint. There is mild osteoarthritic change in the first carpal-metacarpal joint. No erosive change. IMPRESSION: Comminuted fracture  distal radial metaphysis with fracture extending medially into the radiocarpal joint. Small avulsion of the radial and ulnar styloids with alignment near anatomic in these areas. No other fractures. No dislocation. Bones osteoporotic. Areas of arthropathy, most marked at the scaphotrapezial joint. Electronically Signed   By: Lowella Grip III M.D.   On: 07/23/2017 15:03    Procedures Procedures (including critical care time)  Medications Ordered in ED Medications - No data to display   Initial Impression / Assessment and Plan / ED Course  I have reviewed the triage vital signs and the nursing notes.  Pertinent labs & imaging results that were available during my care of the patient were reviewed by me and considered in my medical decision making (see chart for details).     Patient seen and examined.  Extremity is neurovascularly intact at time of exam.  X-ray performed and reviewed by myself.  Patient with comminuted impacted right distal radius fracture.  She will need splinted and given orthopedic follow-up.  Will provide pain medicine for home. Use pain medication only under direct supervision at the lowest possible dose needed to control your  pain.   Vital signs reviewed and are as follows: BP 123/88 (BP Location: Left Arm)   Pulse 64   Temp 98.1 F (36.7 C) (Oral)   Ht 5\' 2"  (1.575 m)   Wt 57.6 kg (127 lb)   SpO2 100%   BMI 23.23 kg/m     Final Clinical Impressions(s) / ED Diagnoses   Final diagnoses:  Closed fracture of distal end of right radius, unspecified fracture morphology, initial encounter  Closed displaced fracture of styloid process of right ulna with delayed healing, subsequent encounter   Patient with uncomplicated wrist fracture.  Do not feel that this needs reduce currently based on alignment.  Hand is neurovascularly intact.  Patient discussed with Dr. Zoe Lan who has seen patient.  Orthopedic hand follow-up given.  ED Discharge Orders        Ordered    HYDROcodone-acetaminophen (NORCO/VICODIN) 5-325 MG tablet     07/23/17 1625       Carlisle Cater, PA-C 07/23/17 1627    Charlesetta Shanks, MD 07/23/17 1719

## 2017-07-23 NOTE — ED Triage Notes (Signed)
Per EMS patient fell at home while raking leaves.  Reports injury to right wrist.  Denies any other injuries.

## 2017-07-23 NOTE — Discharge Instructions (Signed)
Please read and follow all provided instructions.  Your diagnoses today include:  1. Closed fracture of distal end of right radius, unspecified fracture morphology, initial encounter   2. Closed displaced fracture of styloid process of right ulna with delayed healing, subsequent encounter     Tests performed today include:  An x-ray of the affected area - shows a broken wrist  Vital signs. See below for your results today.   Medications prescribed:   Vicodin (hydrocodone/acetaminophen) - narcotic pain medication  DO NOT drive or perform any activities that require you to be awake and alert because this medicine can make you drowsy. BE VERY CAREFUL not to take multiple medicines containing Tylenol (also called acetaminophen). Doing so can lead to an overdose which can damage your liver and cause liver failure and possibly death.  Use pain medication only under direct supervision at the lowest possible dose needed to control your pain.   Take any prescribed medications only as directed.  Home care instructions:   Follow any educational materials contained in this packet  Follow R.I.C.E. Protocol:  R - rest your injury   I  - use ice on injury without applying directly to skin  C - compress injury with bandage or splint  E - elevate the injury as much as possible  Follow-up instructions: Please follow-up with the provided orthopedic physician for treatment of your broken wrist.  Call tomorrow for an appointment.  Return instructions:   Please return if your fingers are numb or tingling, appear gray or blue, or you have severe pain (also elevate the arm and loosen splint or wrap if you were given one)  Please return to the Emergency Department if you experience worsening symptoms.   Please return if you have any other emergent concerns.  Additional Information:  Your vital signs today were: BP 123/88 (BP Location: Left Arm)    Pulse 64    Temp 98.1 F (36.7 C) (Oral)     Ht 5\' 2"  (1.575 m)    Wt 57.6 kg (127 lb)    SpO2 100%    BMI 23.23 kg/m  If your blood pressure (BP) was elevated above 135/85 this visit, please have this repeated by your doctor within one month. --------------

## 2017-07-23 NOTE — ED Provider Notes (Signed)
Medical screening examination/treatment/procedure(s) were conducted as a shared visit with non-physician practitioner(s) and myself.  I personally evaluated the patient during the encounter.  None It was raking when she fell in her yard.  She reports pain in her right wrist.  There is evident deformity.  She denies any headache, neck pain, back pain or lower extremity pain.  She reports that he was able to walk after the incident.  Patient is alert and nontoxic.  Normocephalic atraumatic.  No cervical spine to palpation.  Obvious wrist deformity.  Neurovascularly intact.  Normal range of motion lower extremities without pain.  Agree with plan of management.   Charlesetta Shanks, MD 07/23/17 [id number]

## 2017-07-24 DIAGNOSIS — S52571A Other intraarticular fracture of lower end of right radius, initial encounter for closed fracture: Secondary | ICD-10-CM | POA: Diagnosis not present

## 2017-08-07 DIAGNOSIS — S52571D Other intraarticular fracture of lower end of right radius, subsequent encounter for closed fracture with routine healing: Secondary | ICD-10-CM | POA: Diagnosis not present

## 2017-08-07 DIAGNOSIS — S52571A Other intraarticular fracture of lower end of right radius, initial encounter for closed fracture: Secondary | ICD-10-CM | POA: Diagnosis not present

## 2017-08-19 ENCOUNTER — Encounter: Payer: Self-pay | Admitting: Neurology

## 2017-08-19 ENCOUNTER — Ambulatory Visit: Payer: Medicare HMO | Admitting: Neurology

## 2017-08-19 VITALS — BP 126/76 | HR 76 | Ht 61.0 in | Wt 132.0 lb

## 2017-08-19 DIAGNOSIS — F0390 Unspecified dementia without behavioral disturbance: Secondary | ICD-10-CM | POA: Diagnosis not present

## 2017-08-19 MED ORDER — MEMANTINE HCL 5 MG PO TABS: 5.0000 mg | ORAL_TABLET | Freq: Every day | ORAL | 5 refills | Status: DC

## 2017-08-19 NOTE — Progress Notes (Signed)
Subjective:    Patient ID: Jessica [name] is a 82 y.o. female.  HPI     Interim history:   Ms. Jessica [name] is a 82 year old right-handed woman with an underlying medical history of thyroid disease, vitamin D deficiency, reflux disease, depression, chronic kidney disease, hyperlipidemia, anxiety and overweight state, who presents for follow-up consultation of her memory loss. The patient is accompanied by her sister, Jessica [name], again today. I last saw her on 06/14/2016, at which time she had a recent fall in January 2018 on ice. She had a head CT at the time. I reviewed the emergency room records at the time of her last visit. I suggested we start her on Exelon low-dose.   She had an interim follow-up appointment with Jessica [name] on 09/18/2016 at which time her MMSE was 17. She had stopped taking the Namenda due to GI upset. She declined a prescription for Namenda at the time.   Today, 08/19/2017 (all dictated new, as well as above notes, some dictation done in note pad or Word, outside of chart, may appear as copied):  She reports very little on her own today. Unfortunately, she had a recent fall last month. Have reviewed the emergency room records from 07/23/2017. She sustained of right-sided distal radius fracture. She is being treated conservatively with a splint. She saw Jessica [name] in hand surgery for this, has an appointment this week for follow-up.  Her sister reports that she is declining memory wise. She keeps asking the same questions over and over again and needs redirection for even simple tasks. Sister has some help from extended family members that live close by, including to his sister-in-laws and a cousin.   The patient's allergies, current medications, family history, past medical history, past social history, past surgical history and problem list were reviewed and updated as appropriate.    Previously (copied from previous notes for reference):   I saw her on 03/12/2016, at  which time she reported doing okay, she had no specific complaints, but was not taking her memory medication and gave no reason for it. She had foot pain bilaterally, has a history of hammertoes and bunions. She did not have a recent fall. Sr. described 2 episodes of decreased attentiveness and staring, no actual convulsions. Her sister moved in with the patient in September 2017. Her MMSE was 19/30, CDT: 3/4, AFT: 6/min. I suggested she fill her prescription for Exelon capsules. She could not afford Exelon patches and did not tolerate or reported not being able to tolerate Aricept generic. I suggested further workup with EEG. She had this on 04/18/2016. This was reported as normal in the awake and drowsy states. We called with test results.     I saw her on 11/08/15, at which time she reported that she quit taking the generic Aricept after a few days as she had some vomiting or regurgitation once with it and she did not want to take it. Her sister was in the process of moving in with her. I prescribed Exelon patch which she could not afford and I changed it to Exelon generic capsules.    I first met her on 08/08/2015 at the request of her primary care physician, at which time the patient reported a several month history to T8 year of memory loss, including short-term memory problems, forgetfulness, misplacing things. She had no history of behavioral changes. She had a recent neuropsychological evaluation under Dr. Valentina [name] and findings were suggestive of memory loss and unspecified depressive disorder.  Her MMSE was 24/30, clock drawing 3 out of 4, animal fluency 6/m. We talked about her driving. She was advised about long-term living situation and getting a call alert button. She was advised to limit her driving. I suggested we start her on donepezil low dose with gradual titration from 5 mg to 10 mg.   I suggested we proceed with a brain MRI. She had a brain MRI without contrast on 08/15/2015 which I  reviewed: IMPRESSION:  This MRI of the brain without contrast shows the following: 1.  Moderate generalized cortical atrophy. 2.  Mild extent of scattered T2/FLAIR hyperintense foci in the white matter of both hemispheres most consistent with chronic microvascular ischemic change. 3.  There are no acute findings.   We tried calling her or her sister for test results.   08/08/15: She reports memory loss for the past few months or up to a year. She has primarily short-term memory issues, with forgetfulness, misplacing things etc. She has no history of delusion, hallucinations, or behavioral disturbances.  She lives by herself. Sister lives nearby and checks on her nearly daily, talks to her every day and stays overnight on Wednesdays and Sundays as they go to the same church and the [place] in walking distance from where the patient lives.  There is no obvious family history of dementia. The patient is single. She has no children. Their oldest sister and their youngest sister passed away and 2 brothers passed away. Neither one of them had dementia as I understand.    I reviewed your office note from 04/25/2015, which you kindly included. Recent blood work from 02/21/2015 was reviewed as well. CBC with differential was normal, CMP showed no significant abnormality, TSH 2.13, vitamin D level within range.  She has had flareup in her depression and anxiety.  She had a neuropsychological evaluation with Dr. Valentina [name] on 07/14/2015, and I reviewed the report:    Memory loss R41.3 Unspecified depressive disorder F32.9   Recommendation Consider referral to neurology for further assessment.   Of note, she went to the emergency room on 07/05/2015 for chest pain. I reviewed the records. EKG and cardiac enzymes were unremarkable. She was describing a sharp chest pain.   Her Past Medical History Is Significant For: Past Medical History:  Diagnosis Date  . Anxiety   . CKD (chronic kidney disease), stage  III ([place])   . GERD without esophagitis   . Hyperlipemia   . Major depressive disorder, single episode, unspecified   . Memory difficulty   . Thyroid disease   . Vitamin D deficiency     Her Past Surgical History Is Significant For: Past Surgical History:  Procedure Laterality Date  . BREAST BIOPSY Left   . CATARACT EXTRACTION, BILATERAL    . GLAUCOMA SURGERY    . HYSTEROSCOPY W/D&C N/A 02/14/2017   Procedure: DILATATION AND CURETTAGE /HYSTEROSCOPY;  Surgeon: Sanjuana Kava, MD;  Location: [facility];  Service: Gynecology;  Laterality: N/A;  . LIVER BIOPSY  2018  . WRIST SURGERY Left     Her Family History Is Significant For: Family History  Problem Relation Age of Onset  . CAD Father   . CAD Brother   . CVA Paternal Grandmother     Her Social History Is Significant For: Social History   Socioeconomic History  . Marital status: Single    Spouse name: Not on file  . Number of children: 0  . Years of education: college   . Highest  education level: Not on file  Occupational History  . Occupation: Retired  Scientific laboratory technician  . Financial resource strain: Not on file  . Food insecurity:    Worry: Not on file    Inability: Not on file  . Transportation needs:    Medical: Not on file    Non-medical: Not on file  Tobacco Use  . Smoking status: Never Smoker  . Smokeless tobacco: Never Used  Substance and Sexual Activity  . Alcohol use: No    Alcohol/week: 0.0 oz  . Drug use: No  . Sexual activity: Not on file  Lifestyle  . Physical activity:    Days per week: Not on file    Minutes per session: Not on file  . Stress: Not on file  Relationships  . Social connections:    Talks on phone: Not on file    Gets together: Not on file    Attends religious service: Not on file    Active member of club or organization: Not on file    Attends meetings of clubs or organizations: Not on file    Relationship status: Not on file  Other Topics Concern  . Not on file   Social History Narrative   Drinks 1 cup of coffee a day     Her Allergies Are:  No Known Allergies:   Her Current Medications Are:  Outpatient Encounter Medications as of 08/19/2017  Medication Sig  . Cholecalciferol (VITAMIN D3) 2000 units capsule Take 2,000 Units by mouth daily.   Marland Kitchen esomeprazole (NEXIUM) 20 MG capsule Take 20 mg by mouth daily at 12 noon.  Marland Kitchen esomeprazole (NEXIUM) 40 MG capsule Take 40 mg by mouth every morning.  Marland Kitchen levothyroxine (SYNTHROID, LEVOTHROID) 75 MCG tablet Take 75 mcg by mouth daily before breakfast.  . pravastatin (PRAVACHOL) 40 MG tablet Take 40 mg by mouth every evening.   . sertraline (ZOLOFT) 50 MG tablet Take 50 mg by mouth daily.  . [DISCONTINUED] feeding supplement, ENSURE ENLIVE, (ENSURE ENLIVE) LIQD Take 237 mLs by mouth 2 (two) times daily between meals.  . [DISCONTINUED] HYDROcodone-acetaminophen (NORCO/VICODIN) 5-325 MG tablet Take 0.5-1 tablets every 6 hours as needed for severe pain  . [DISCONTINUED] ibuprofen (ADVIL,MOTRIN) 800 MG tablet Take 1 tablet (800 mg total) every 8 (eight) hours as needed by mouth.   No facility-administered encounter medications on file as of 08/19/2017.   :  Review of Systems:  Out of a complete 14 point review of systems, all are reviewed and negative with the exception of these symptoms as listed below: Review of Systems  Neurological:       Pt presents today to discuss her memory. Pt had a fall recently and fractured her right arm. Pt's sister is her caregiver.    Objective:  Neurological Exam  Physical Exam Physical Examination:   Vitals:   08/19/17 1152  BP: 126/76  Pulse: 76    General Examination: The patient is a very pleasant 82 y.o. female in no acute distress. She appears well-developed and well-nourished and well groomed.   HEENT: Normocephalic, atraumatic, pupils are equal, round and reactive to light and accommodation. Corrective eyeglasses in place. Extraocular tracking is good without  limitation to gaze excursion or nystagmus noted. Normal smooth pursuit is noted. Hearing is grossly intact, or mildly impaired. Face is symmetric with normal facial animation and normal facial sensation. Speech is clear with no dysarthria noted. There is no hypophonia. There is no lip, neck/head, jaw or voice tremor. Neck is  supple with full range of passive and active motion. There are no carotid bruits on auscultation. Oropharynx exam reveals: mild mouth dryness, adequate dental hygiene and mild airway crowding. Mallampati is class II. Tongue protrudes centrally and palate elevates symmetrically.   Chest: Clear to auscultation without wheezing, rhonchi or crackles noted.  Heart: S1+S2+0, regular and normal without murmurs, rubs or gallops noted.   Abdomen: Soft, non-tender and non-distended with normal bowel sounds appreciated on auscultation.  Extremities: There is 1+ pitting edema in the distal lower extremities bilaterally.   Skin: Warm and dry without trophic changes noted.  Musculoskeletal: exam reveals no obvious joint deformities, tenderness or joint swelling or erythema, with the exception of right wrist brace, denies pain.   Neurologically:  Mental status: The patient is awake, alert and paying fair attention. Her immediate and remote memory, attention, language skills and fund of knowledge are impaired. Mood is normal and affect is normal.   On 08/08/2015: MMSE: 24/30, clock drawing 3 out of 4, animal fluency 6/m.  On 03/12/2016: MMSE: 19/30, CDT: 3/4, AFT: 6/min.  On 09/18/16: MMSE: 17/30.   On 08/19/2017: MMSE: 9/30, CDT: 1/4, AFT: 2/min.  Cranial nerves II - XII are as described above under HEENT exam.  Motor exam: Normal bulk, normal strength for age and tone is noted. There is no drift, tremor or rebound. Romberg is not test d/t safety. Reflexes are 1+ throughout. Fine motor skills and coordination: globally mildly impaired.  Cerebellar testing: No dysmetria or  intention tremor.  Sensory exam: intact to light touch in the upper and lower extremities.  Gait, station and balance: She stands up slowly, she walks cautiously, she has no cane or walking aid. Balance is mildly impaired, stable, holds her sister's hand when walking.  Assessment and Plan:    In summary, ZYASIA HALBLEIB is a very pleasant 82 year old female with as underlying medical history of vitamin D deficiency, hyperlipidemia, thyroid disease, reflux disease, depression, anxiety, and chronic kidney disease, who presents for follow-up consultation of her dementia without obvious behavioral disturbance, intolerant to Aricept and Exelon. Workup in the form of brain MRI and neuropsychological testing was supportive of the diagnosis although neuropsychological testing gave no definitive diagnosis of dementia and concerns for memory loss and depressive disorder. Her sister moved in with the patient several months, which has been helpful, but she is also elderly, 82 yo. Patient has not been driving. She has had some falls. She had a recent fall with injury to the right wrist. She is followed by hand surgery for this. She had stopped taking her antidepressant medication but was encouraged to restart it and is on low dose sertraline. She reported side effects with donepezil but was vague about it. She could not afford Exelon patch and started Exelon generic capsules, but stopped it in 2018. We did an EEG which showed nonspecific findings, normal results, she had no additional spells of decreased attentiveness or zoning out spells. Brain MRI had shown moderate generalized atrophy and mild white matter changes. Her memory scores have declined consistently with time. She is currently not on any memory medication, she is willing to start Namenda low-dose, I suggested generic Namenda 5 mg strength one pill daily in the evening. Will be cautious and mindful of side effects. She is advised to follow-up in about 3  months at which time she can see Jessica Givens, NP, again and we will try to increase her medication if possible.I answered all their questions today and the  patient and her sister were in agreement.

## 2017-08-19 NOTE — Patient Instructions (Signed)
We will try you on low dose namenda generic 5 mg once daily in the evening, with gradual build up. Side effects include: nausea, confusion, hallucination, personality changes. If you are having mild side effects, try to stick with the treatment as these initial side effects may go away after the first 10-14 days.    We will have you follow up in 3 months with Jinny Blossom, nurse Practitioner.

## 2017-08-21 DIAGNOSIS — S52571D Other intraarticular fracture of lower end of right radius, subsequent encounter for closed fracture with routine healing: Secondary | ICD-10-CM | POA: Diagnosis not present

## 2017-09-10 ENCOUNTER — Other Ambulatory Visit: Payer: Self-pay | Admitting: Neurology

## 2017-09-11 DIAGNOSIS — S52571D Other intraarticular fracture of lower end of right radius, subsequent encounter for closed fracture with routine healing: Secondary | ICD-10-CM | POA: Diagnosis not present

## 2017-10-03 ENCOUNTER — Ambulatory Visit: Payer: Medicare HMO | Admitting: Podiatry

## 2017-10-03 ENCOUNTER — Encounter: Payer: Self-pay | Admitting: Podiatry

## 2017-10-03 DIAGNOSIS — B351 Tinea unguium: Secondary | ICD-10-CM | POA: Diagnosis not present

## 2017-10-03 DIAGNOSIS — M79671 Pain in right foot: Secondary | ICD-10-CM | POA: Diagnosis not present

## 2017-10-03 DIAGNOSIS — M79672 Pain in left foot: Secondary | ICD-10-CM | POA: Diagnosis not present

## 2017-10-03 NOTE — Progress Notes (Signed)
Subjective: 82 y.o. year old female patient presents accompanied by her sister requesting toe nails trimmed. Nails are very thick and hurts in shoes. Patient is hard of hearing.  Objective: Dermatologic: Thick yellow deformed nails x 10. Vascular: Pedal pulses are all palpable. Orthopedic: Severe hallux valgus with bunion and overlapping first and 2nd digits bilateral. Neurologic: All epicritic and tactile sensations grossly intact.  Assessment: Dystrophic mycotic nails x 10. Pain in both feet. Severe digital deformity and subluxed 1st and 2nd MPJ bilateral.  Treatment: All mycotic nails debrided.  Return in 3 months or sooner if needed.

## 2017-10-03 NOTE — Patient Instructions (Signed)
Seen for hypertrophic nails. All nails debrided. Return in 3 months or as needed.  

## 2017-10-15 IMAGING — DX DG KNEE COMPLETE 4+V*R*
4 series · 4 of 4 positions shown · non-contrast
Comparison: None.

CLINICAL DATA: 80-year-old female status post fall several days ago

EXAM:
RIGHT KNEE - COMPLETE 4+ VIEW

[knee ap]
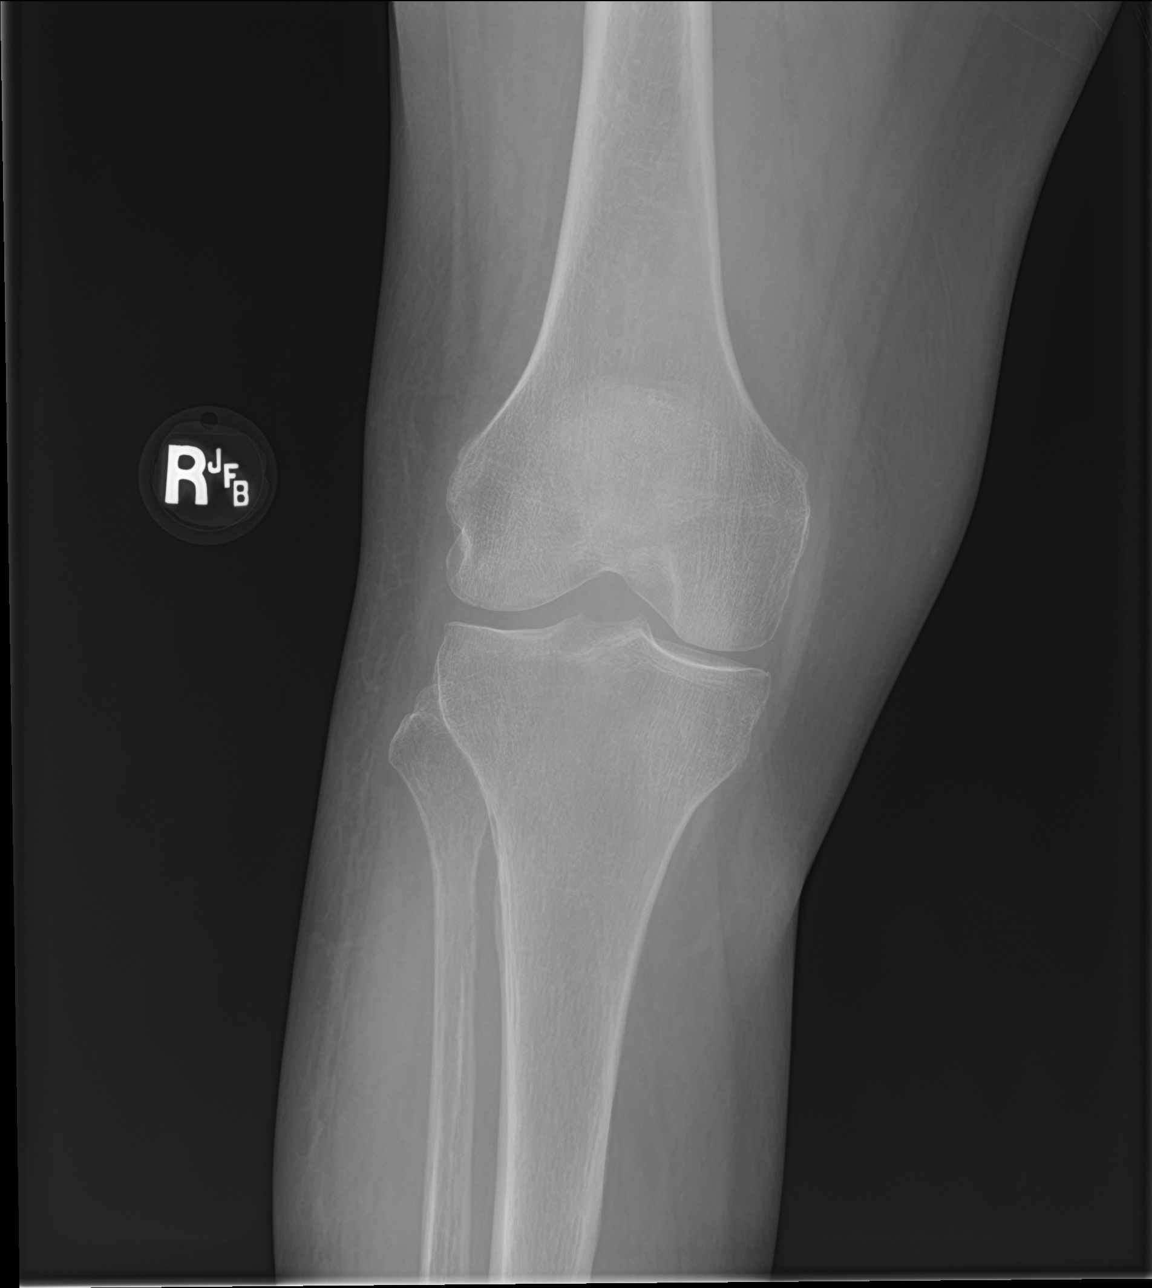

[knee lat]
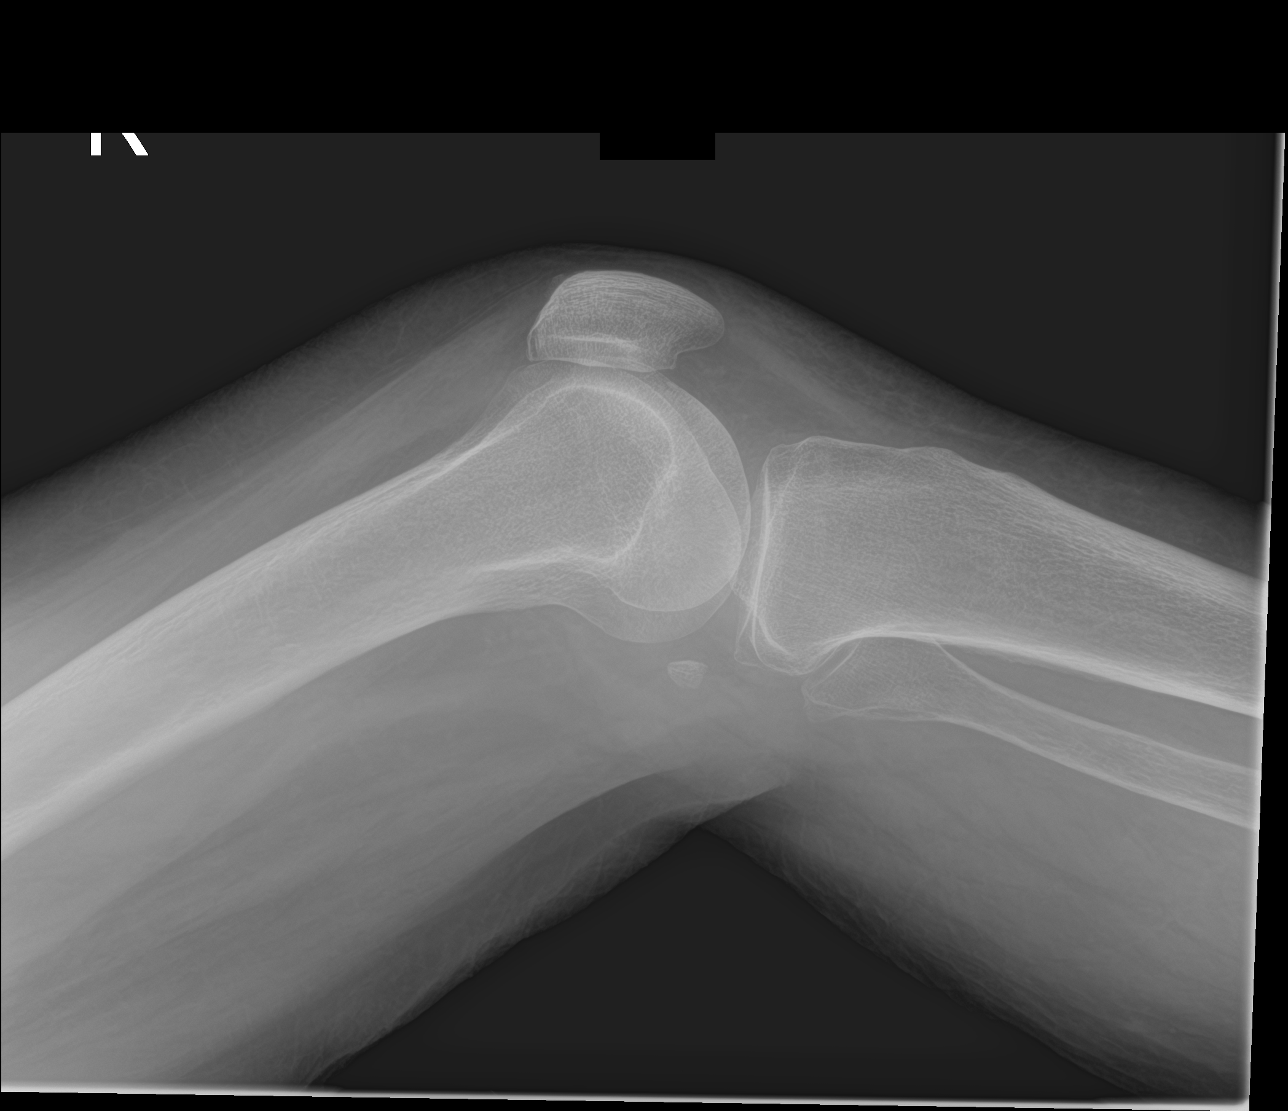

[knee obl (1 of 2)]
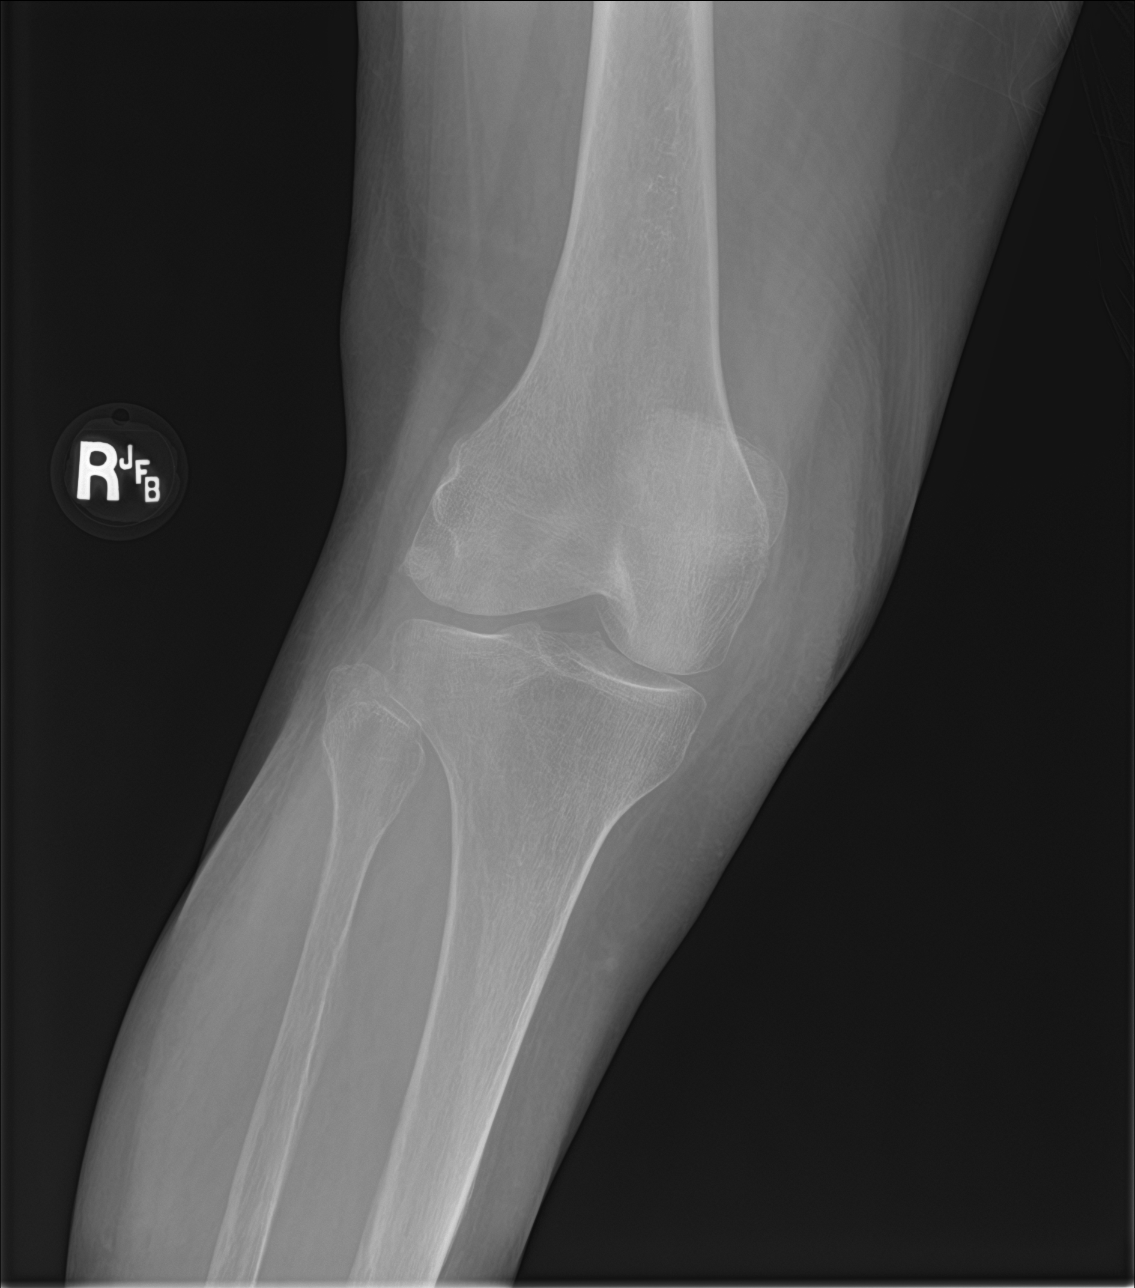

[knee obl (2 of 2)]
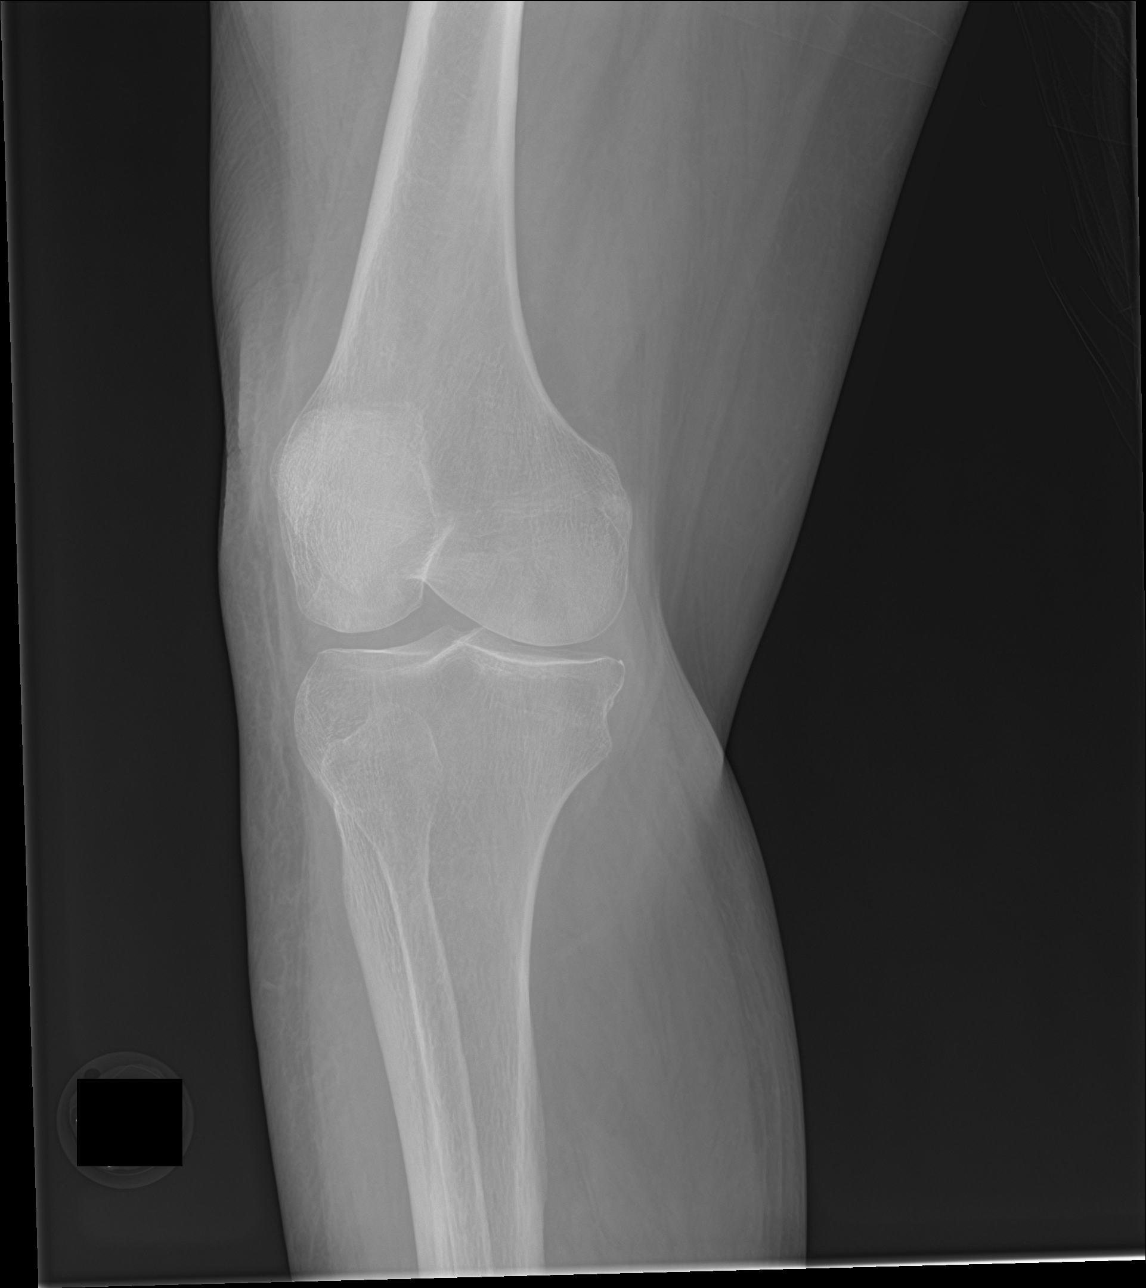

[4 of 4 positions shown; findings below may reference images not displayed]

FINDINGS: No evidence of fracture, dislocation, or joint effusion. No evidence
of arthropathy or other focal bone abnormality. Soft tissues are
unremarkable.
IMPRESSION: Negative.

## 2017-10-15 IMAGING — CT CT CERVICAL SPINE W/O CM
3 of 11 series · 5 of 33 positions shown, 6 images · non-contrast
Comparison: Prior CT scan of the head 05/08/2016

CLINICAL DATA: 80-year-old female status post fall 3 days
previously

EXAM:
CT HEAD WITHOUT CONTRAST
CT MAXILLOFACIAL WITHOUT CONTRAST
CT CERVICAL SPINE WITHOUT CONTRAST
TECHNIQUE: Multidetector CT imaging of the head, cervical spine, and
maxillofacial structures were performed using the standard protocol
without intravenous contrast. Multiplanar CT image reconstructions
of the cervical spine and maxillofacial structures were also
generated.

[Series 10: coronal soft · coronal · 0.32mm/px · 2 of 64 slices shown]
[im 22/64  bone]
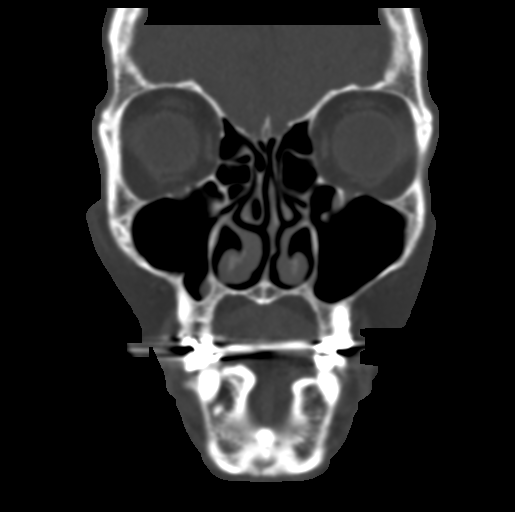
[im 43/64  bone]
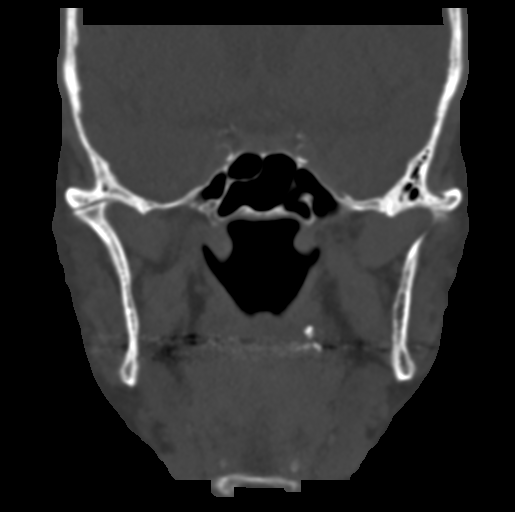

[Series 13: sagittal bone · sagittal · 0.33mm/px · 1 of 72 slices shown]
[im 36/72  bone]
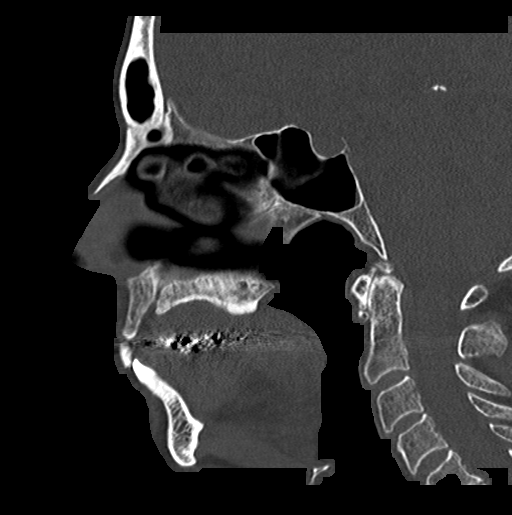

[Series 19: orthogonal axials · axial · 0.23mm/px · z∈[-306,-245]mm · 2 of 102 slices shown, 3 images]
[im 34/102  soft-tissue]
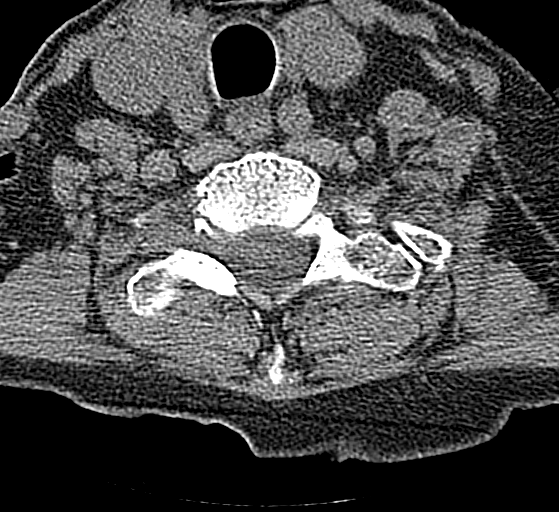
[im 34/102  bone]
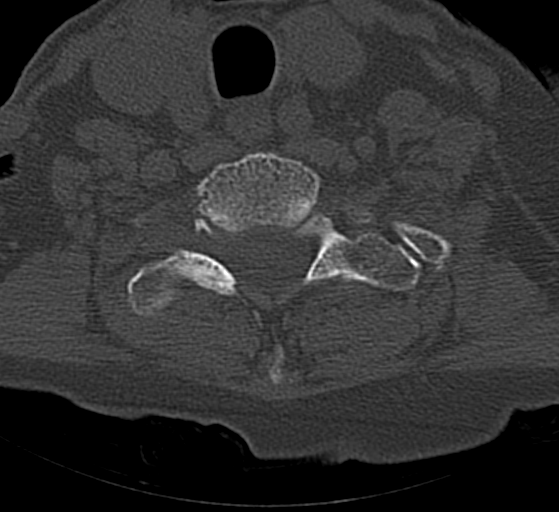
[im 68/102  bone]
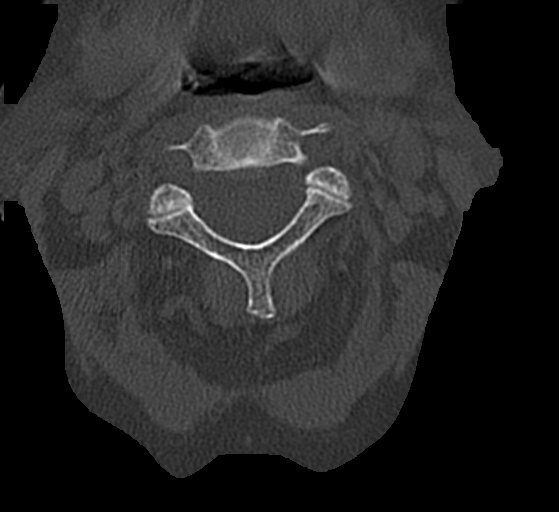

[5 of 33 positions shown; findings below may reference images not displayed]

FINDINGS: CT HEAD FINDINGS

Brain: No evidence of acute infarction, hemorrhage, hydrocephalus,
extra-axial collection or mass lesion/mass effect. Central and
cortical atrophy with mild ex vacuo dilatation of the lateral
ventricles.

Vascular: No hyperdense vessel or unexpected calcification.

Skull: Normal. Negative for fracture or focal lesion.

Other: None.

CT MAXILLOFACIAL FINDINGS

Osseous: No fracture or mandibular dislocation. No destructive
process.

Orbits: Negative. No traumatic or inflammatory finding.

Sinuses: Clear.

Soft tissues: Small contusion overlying the right maxillary antrum.

CT CERVICAL SPINE FINDINGS

Alignment: Normal.

Skull base and vertebrae: No acute fracture. No primary bone lesion
or focal pathologic process.

Soft tissues and spinal canal: No prevertebral fluid or swelling. No
visible canal hematoma.

Disc levels: Mild focal degenerative disc disease at C5-C6 and
C6-C7.

Upper chest: Negative.

Other: None.
IMPRESSION: CT HEAD

1. No acute intracranial abnormality.
2. Stable atrophy and ex vacuo ventriculomegaly.
CT FACE

1. Soft tissue contusion overlying the right maxillary antrum
without evidence of underlying fracture.
CT CSPINE

1. No acute fracture or malalignment.

## 2017-10-16 DIAGNOSIS — S52571D Other intraarticular fracture of lower end of right radius, subsequent encounter for closed fracture with routine healing: Secondary | ICD-10-CM | POA: Diagnosis not present

## 2017-10-31 DIAGNOSIS — M810 Age-related osteoporosis without current pathological fracture: Secondary | ICD-10-CM | POA: Diagnosis not present

## 2017-11-26 ENCOUNTER — Other Ambulatory Visit: Payer: Self-pay

## 2017-11-26 ENCOUNTER — Emergency Department (HOSPITAL_BASED_OUTPATIENT_CLINIC_OR_DEPARTMENT_OTHER): Payer: Medicare HMO

## 2017-11-26 ENCOUNTER — Encounter (HOSPITAL_BASED_OUTPATIENT_CLINIC_OR_DEPARTMENT_OTHER): Payer: Self-pay

## 2017-11-26 ENCOUNTER — Emergency Department (HOSPITAL_BASED_OUTPATIENT_CLINIC_OR_DEPARTMENT_OTHER)
Admission: EM | Admit: 2017-11-26 | Discharge: 2017-11-26 | Disposition: A | Discharge status: Home / Self Care | Payer: Medicare HMO | Attending: Emergency Medicine | Admitting: Emergency Medicine

## 2017-11-26 DIAGNOSIS — E039 Hypothyroidism, unspecified: Secondary | ICD-10-CM | POA: Insufficient documentation

## 2017-11-26 DIAGNOSIS — F0390 Unspecified dementia without behavioral disturbance: Secondary | ICD-10-CM | POA: Insufficient documentation

## 2017-11-26 DIAGNOSIS — N183 Chronic kidney disease, stage 3 (moderate): Secondary | ICD-10-CM | POA: Insufficient documentation

## 2017-11-26 DIAGNOSIS — R079 Chest pain, unspecified: Secondary | ICD-10-CM | POA: Diagnosis not present

## 2017-11-26 DIAGNOSIS — I700 Atherosclerosis of aorta: Secondary | ICD-10-CM | POA: Diagnosis not present

## 2017-11-26 DIAGNOSIS — R072 Precordial pain: Secondary | ICD-10-CM | POA: Diagnosis not present

## 2017-11-26 DIAGNOSIS — Z79899 Other long term (current) drug therapy: Secondary | ICD-10-CM | POA: Diagnosis not present

## 2017-11-26 DIAGNOSIS — J449 Chronic obstructive pulmonary disease, unspecified: Secondary | ICD-10-CM | POA: Diagnosis not present

## 2017-11-26 HISTORY — DX: Unspecified dementia, unspecified severity, without behavioral disturbance, psychotic disturbance, mood disturbance, and anxiety: F03.90

## 2017-11-26 LAB — CBC
HCT: 34.2 % — ABNORMAL LOW (ref 36.0–46.0)
HEMOGLOBIN: 11.9 g/dL — AB (ref 12.0–15.0)
MCH: 31.2 pg (ref 26.0–34.0)
MCHC: 34.8 g/dL (ref 30.0–36.0)
MCV: 89.5 fL (ref 78.0–100.0)
Platelets: 276 10*3/uL (ref 150–400)
RBC: 3.82 MIL/uL — ABNORMAL LOW (ref 3.87–5.11)
RDW: 12.5 % (ref 11.5–15.5)
WBC: 6.5 10*3/uL (ref 4.0–10.5)

## 2017-11-26 LAB — BASIC METABOLIC PANEL
Anion gap: 9 (ref 5–15)
BUN: 16 mg/dL (ref 8–23)
CO2: 23 mmol/L (ref 22–32)
CREATININE: 1.1 mg/dL — AB (ref 0.44–1.00)
Calcium: 8.6 mg/dL — ABNORMAL LOW (ref 8.9–10.3)
Chloride: 103 mmol/L (ref 98–111)
GFR calc non Af Amer: 46 mL/min — ABNORMAL LOW (ref >60)
GFR, EST AFRICAN AMERICAN: 54 mL/min — AB (ref >60)
Glucose, Bld: 112 mg/dL — ABNORMAL HIGH (ref 70–99)
Potassium: 4.3 mmol/L (ref 3.5–5.1)
SODIUM: 135 mmol/L (ref 135–145)

## 2017-11-26 LAB — TROPONIN I

## 2017-11-26 NOTE — Discharge Instructions (Addendum)
Work-up for the chest pain without any significant abnormalities.  Renal function slightly worse than a year ago and a mild anemia recommend close follow-up with primary care doctor for recheck of these lab values.  Return for any new or worse symptoms.

## 2017-11-26 NOTE — ED Provider Notes (Signed)
[facility] EMERGENCY DEPARTMENT Provider Note   CSN: [id number] Arrival date & time: 11/26/17  1131     History   Chief Complaint Chief Complaint  Patient presents with  . Chest Pain    HPI GOLDYE TOURANGEAU is a 82 y.o. female.  Patient brought in by sister lives with her sister.  Patient has dementia.  Apparently there is been a complaint of anterior chest pain for the past few days.  Intermittently.  Duration and onset very difficult to assess to the dementia.  Patient seems to be nontoxic no acute distress right now.  Past medical history negative for any significant cardiac history.  Apparently no shortness of breath or nausea vomiting.     Past Medical History:  Diagnosis Date  . Anxiety   . CKD (chronic kidney disease), stage III ([place])   . Dementia   . GERD without esophagitis   . Hyperlipemia   . Major depressive disorder, single episode, unspecified   . Memory difficulty   . Thyroid disease   . Vitamin D deficiency     Patient Active Problem List   Diagnosis Date Noted  . Increased endometrial stripe thickness   . Hypothyroidism   . Acute lower UTI 08/11/2016  . Hyponatremia 08/11/2016  . Liver mass 08/11/2016  . Nausea and vomiting 08/10/2016    Past Surgical History:  Procedure Laterality Date  . BREAST BIOPSY Left   . CATARACT EXTRACTION, BILATERAL    . GLAUCOMA SURGERY    . HYSTEROSCOPY W/D&C N/A 02/14/2017   Procedure: DILATATION AND CURETTAGE /HYSTEROSCOPY;  Surgeon: Sanjuana Kava, MD;  Location: [facility];  Service: Gynecology;  Laterality: N/A;  . LIVER BIOPSY  2018  . WRIST SURGERY Left      OB History   None      Home Medications    Prior to Admission medications   Medication Sig Start Date End Date Taking? Authorizing Provider  Cholecalciferol (VITAMIN D3) 2000 units capsule Take 2,000 Units by mouth daily.     [provider]  esomeprazole (NEXIUM) 20 MG capsule Take 20 mg by mouth daily at 12  noon.    [provider]  esomeprazole (NEXIUM) 40 MG capsule Take 40 mg by mouth every morning.    [provider]  levothyroxine (SYNTHROID, LEVOTHROID) 75 MCG tablet Take 75 mcg by mouth daily before breakfast.    [provider]  memantine (NAMENDA) 5 MG tablet TAKE 1 TABLET BY MOUTH EVERYDAY AT BEDTIME 09/10/17   Star Age, MD  pravastatin (PRAVACHOL) 40 MG tablet Take 40 mg by mouth every evening.  05/11/16   [provider]  sertraline (ZOLOFT) 50 MG tablet Take 50 mg by mouth daily. 02/24/16   [provider]    Family History Family History  Problem Relation Age of Onset  . CAD Father   . CAD Brother   . CVA Paternal Grandmother     Social History Social History   Tobacco Use  . Smoking status: Never Smoker  . Smokeless tobacco: Never Used  Substance Use Topics  . Alcohol use: No    Alcohol/week: 0.0 standard drinks  . Drug use: No     Allergies   Patient has no known allergies.   Review of Systems Review of Systems  Unable to perform ROS: Dementia     Physical Exam Updated Vital Signs BP 120/68   Pulse 75   Temp 98.7 F (37.1 C) (Oral)   Resp 17  SpO2 99%   Physical Exam  Constitutional: She appears well-developed and well-nourished. No distress.  HENT:  Head: Normocephalic and atraumatic.  Mouth/Throat: Oropharynx is clear and moist.  Eyes: Pupils are equal, round, and reactive to light. EOM are normal.  Neck: Normal range of motion. Neck supple.  Cardiovascular: Normal rate, regular rhythm and normal heart sounds.  Pulmonary/Chest: Effort normal and breath sounds normal. No respiratory distress.  Abdominal: Soft. Bowel sounds are normal. There is no tenderness.  Musculoskeletal: Normal range of motion. She exhibits no edema.  Neurological: She is alert. No cranial nerve deficit or sensory deficit. She exhibits normal muscle tone.  Skin: Skin is warm.  Nursing note and vitals reviewed.    ED  Treatments / Results  Labs (all labs ordered are listed, but only abnormal results are displayed) Labs Reviewed  CBC - Abnormal; Notable for the following components:      Result Value   RBC 3.82 (*)    Hemoglobin 11.9 (*)    HCT 34.2 (*)    All other components within normal limits  BASIC METABOLIC PANEL - Abnormal; Notable for the following components:   Glucose, Bld 112 (*)    Creatinine, Ser 1.10 (*)    Calcium 8.6 (*)    GFR calc non Af Amer 46 (*)    GFR calc Af Amer 54 (*)    All other components within normal limits  TROPONIN I    EKG EKG Interpretation  Date/Time:  Tuesday November 26 2017 11:49:06 EDT Ventricular Rate:  73 PR Interval:    QRS Duration: 94 QT Interval:  385 QTC Calculation: 425 R Axis:   34 Text Interpretation:  Sinus rhythm No significant change since last tracing Confirmed by Fredia Sorrow [phone]) on 11/26/2017 12:42:07 PM   Radiology Dg Chest 2 View  Result Date: 11/26/2017 CLINICAL DATA:  Two days of chest pain. EXAM: CHEST - 2 VIEW COMPARISON:  Abdominal CT scan for biopsy localization purposes dated October 08, 2016 as well as PA and lateral chest x-ray of Aug 10, 2016 FINDINGS: There is a large hiatal hernia-partially intrathoracic stomach which appears stable. The lungs are mildly hyperinflated. The interstitial markings are coarse. There is no alveolar infiltrate or pleural effusion. Heart is mildly enlarged. The pulmonary vascularity is normal. There is calcification in the wall of the aortic arch. The mediastinum is normal in width. The bony thorax exhibits no acute abnormality. IMPRESSION: COPD.  No pneumonia nor CHF. Thoracic aortic atherosclerosis. Large hiatal hernia-partially intrathoracic stomach. Electronically Signed   By: David  Martinique M.D.   On: 11/26/2017 13:15    Procedures Procedures (including critical care time)  Medications Ordered in ED Medications - No data to display   Initial Impression / Assessment and Plan / ED Course   I have reviewed the triage vital signs and the nursing notes.  Pertinent labs & imaging results that were available during my care of the patient were reviewed by me and considered in my medical decision making (see chart for details).     Patient with a history of dementia lives with her sister her sister brought her in.  Apparently there is been a complaint of chest pain for a few days but sister just found out about it today.  Patient states is all over her front part of her chest.  Denies any pain now.  Work-up here for the chest pain without any acute abnormalities.  Troponin negative basic labs mild anemia a little bit of renal  insufficiency chest x-ray without any evidence of pneumonia pulmonary edema or pleural effusion.  Based on patient's dementia difficult to truly assess the chest pain sounds as if it is more on for a few days work-up seems to be negative patient nontoxic no acute distress.  Recommend close follow-up with primary care provider regarding the renal insufficiency and the slight anemia.  Final Clinical Impressions(s) / ED Diagnoses   Final diagnoses:  Precordial pain    ED Discharge Orders    None       Fredia Sorrow, MD 11/26/17 1342

## 2017-11-26 NOTE — ED Notes (Signed)
Patient transported to X-ray 

## 2017-11-26 NOTE — ED Notes (Signed)
Pt/family verbalized understanding of discharge instructions.   

## 2017-11-26 NOTE — ED Triage Notes (Addendum)
Pt brought to ED by sister-pt with hx dementia and unable to answer ?s-states 'I don't know" to most ?s asked-NAD-to triage in w/c-sister states pt with CP x 2 days-pt does point to entire chest when asked about pain

## 2017-12-26 DIAGNOSIS — E039 Hypothyroidism, unspecified: Secondary | ICD-10-CM | POA: Diagnosis not present

## 2017-12-26 DIAGNOSIS — Z23 Encounter for immunization: Secondary | ICD-10-CM | POA: Diagnosis not present

## 2017-12-26 DIAGNOSIS — F325 Major depressive disorder, single episode, in full remission: Secondary | ICD-10-CM | POA: Diagnosis not present

## 2017-12-26 DIAGNOSIS — F0390 Unspecified dementia without behavioral disturbance: Secondary | ICD-10-CM | POA: Diagnosis not present

## 2017-12-26 DIAGNOSIS — M810 Age-related osteoporosis without current pathological fracture: Secondary | ICD-10-CM | POA: Diagnosis not present

## 2017-12-26 DIAGNOSIS — N183 Chronic kidney disease, stage 3 (moderate): Secondary | ICD-10-CM | POA: Diagnosis not present

## 2017-12-31 DIAGNOSIS — M810 Age-related osteoporosis without current pathological fracture: Secondary | ICD-10-CM | POA: Diagnosis not present

## 2018-01-07 ENCOUNTER — Ambulatory Visit: Payer: Medicare HMO | Admitting: Podiatry

## 2018-01-07 ENCOUNTER — Encounter: Payer: Self-pay | Admitting: Podiatry

## 2018-01-07 DIAGNOSIS — M79671 Pain in right foot: Secondary | ICD-10-CM | POA: Diagnosis not present

## 2018-01-07 DIAGNOSIS — B351 Tinea unguium: Secondary | ICD-10-CM | POA: Diagnosis not present

## 2018-01-07 DIAGNOSIS — M79672 Pain in left foot: Secondary | ICD-10-CM | POA: Diagnosis not present

## 2018-01-07 NOTE — Patient Instructions (Signed)
Seen for hypertrophic nails. All nails debrided. Return as needed.  

## 2018-01-07 NOTE — Progress Notes (Signed)
Subjective: 82 y.o. year old female patient presents accompanied by her sister requesting toe nails trimmed.  Patient is hard of hearing.  Objective: Dermatologic: Thick yellow deformed nails x 10. Vascular: Pedal pulses are all palpable. Orthopedic: Severe hallux valgus with bunion and overlapping first and 2nd digits bilateral. Neurologic: All epicritic and tactile sensations grossly intact.  Assessment: Dystrophic mycotic nails x 10. Pain in both feet. Severe digital deformity and subluxed 1st and 2nd MPJ bilateral.  Treatment: All mycotic nails debrided.  Return in 3 months or sooner if needed.

## 2018-01-30 ENCOUNTER — Ambulatory Visit: Payer: Medicare HMO | Admitting: Adult Health

## 2018-01-30 ENCOUNTER — Encounter: Payer: Self-pay | Admitting: Adult Health

## 2018-01-30 ENCOUNTER — Encounter

## 2018-01-30 VITALS — BP 111/67 | HR 74 | Ht 61.0 in | Wt 138.0 lb

## 2018-01-30 DIAGNOSIS — F0390 Unspecified dementia without behavioral disturbance: Secondary | ICD-10-CM | POA: Diagnosis not present

## 2018-01-30 MED ORDER — MEMANTINE HCL 5 MG PO TABS: 5.0000 mg | ORAL_TABLET | Freq: Two times a day (BID) | ORAL | 3 refills | Status: AC

## 2018-01-30 NOTE — Patient Instructions (Signed)
Your Plan:  Continue to monitor memory Increase Namenda to 5 mg twice a day If your symptoms worsen or you develop new symptoms please let us know.   Thank you for coming to see [place] at [facility]. I hope we have been able to provide you high quality care today.  You may receive a patient satisfaction survey over the next few weeks. We would appreciate your feedback and comments so that we may continue to improve ourselves and the health of our patients.

## 2018-01-30 NOTE — Progress Notes (Signed)
PATIENT: Nakiya Rallis [place] DOB: 1935/04/19  REASON FOR VISIT: follow up HISTORY FROM: patient  HISTORY OF PRESENT ILLNESS: Today 01/30/18: Ms. Cherne is a 82 year old female with a history of memory disturbance.  She returns today for follow-up.  She is here today with her sister.  The patient was started on Namenda 5 mg daily.  Sister reports that she tolerates it well.  She lives with her sister.  She requires assistance with ADLs.  She does not operate a motor vehicle.  Her sister reports that she has a good appetite.  Denies any trouble sleeping.  Her sister does feel that her memory has declined frequently since the last visit.  She returns today for evaluation.  HISTORY 08/19/2017(Copied from Dr. Guadelupe Sabin note): She reports very little on her own today. Unfortunately, she had a recent fall last month. Have reviewed the emergency room records from 07/23/2017. She sustained of right-sided distal radius fracture. She is being treated conservatively with a splint. She saw Dr. Fredna Dow in hand surgery for this, has an appointment this week for follow-up.  Her sister reports that she is declining memory wise. She keeps asking the same questions over and over again and needs redirection for even simple tasks. Sister has some help from extended family members that live close by, including to his sister-in-laws and a cousin.  The patient's allergies, current medications, family history, past medical history, past social history, past surgical history and problem list were reviewed and updated as appropriate.  REVIEW OF SYSTEMS: Out of a complete 14 system review of symptoms, the patient complains only of the following symptoms, and all other reviewed systems are negative.  See HPI  ALLERGIES: No Known Allergies  HOME MEDICATIONS: Outpatient Medications Prior to Visit  Medication Sig Dispense Refill  . Cholecalciferol (VITAMIN D3) 2000 units capsule Take 2,000 Units by mouth daily.     Marland Kitchen  esomeprazole (NEXIUM) 20 MG capsule Take 20 mg by mouth daily at 12 noon.    Marland Kitchen esomeprazole (NEXIUM) 40 MG capsule Take 40 mg by mouth every morning.    Marland Kitchen levothyroxine (SYNTHROID, LEVOTHROID) 75 MCG tablet Take 75 mcg by mouth daily before breakfast.    . memantine (NAMENDA) 5 MG tablet TAKE 1 TABLET BY MOUTH EVERYDAY AT BEDTIME 90 tablet 1  . pravastatin (PRAVACHOL) 40 MG tablet Take 40 mg by mouth every evening.     . sertraline (ZOLOFT) 50 MG tablet Take 50 mg by mouth daily.     No facility-administered medications prior to visit.     PAST MEDICAL HISTORY: Past Medical History:  Diagnosis Date  . Anxiety   . CKD (chronic kidney disease), stage III ([place])   . Dementia ([place])   . GERD without esophagitis   . Hyperlipemia   . Major depressive disorder, single episode, unspecified   . Memory difficulty   . Thyroid disease   . Vitamin D deficiency     PAST SURGICAL HISTORY: Past Surgical History:  Procedure Laterality Date  . BREAST BIOPSY Left   . CATARACT EXTRACTION, BILATERAL    . GLAUCOMA SURGERY    . HYSTEROSCOPY W/D&C N/A 02/14/2017   Procedure: DILATATION AND CURETTAGE /HYSTEROSCOPY;  Surgeon: Sanjuana Kava, MD;  Location: [facility];  Service: Gynecology;  Laterality: N/A;  . LIVER BIOPSY  2018  . WRIST SURGERY Left     FAMILY HISTORY: Family History  Problem Relation Age of Onset  . CAD Father   . CAD Brother   .  CVA Paternal Grandmother     SOCIAL HISTORY: Social History   Socioeconomic History  . Marital status: Single    Spouse name: Not on file  . Number of children: 0  . Years of education: college   . Highest education level: Not on file  Occupational History  . Occupation: Retired  Scientific laboratory technician  . Financial resource strain: Not on file  . Food insecurity:    Worry: Not on file    Inability: Not on file  . Transportation needs:    Medical: Not on file    Non-medical: Not on file  Tobacco Use  . Smoking status: Never Smoker  .  Smokeless tobacco: Never Used  Substance and Sexual Activity  . Alcohol use: No    Alcohol/week: 0.0 standard drinks  . Drug use: No  . Sexual activity: Not on file  Lifestyle  . Physical activity:    Days per week: Not on file    Minutes per session: Not on file  . Stress: Not on file  Relationships  . Social connections:    Talks on phone: Not on file    Gets together: Not on file    Attends religious service: Not on file    Active member of club or organization: Not on file    Attends meetings of clubs or organizations: Not on file    Relationship status: Not on file  . Intimate partner violence:    Fear of current or ex partner: Not on file    Emotionally abused: Not on file    Physically abused: Not on file    Forced sexual activity: Not on file  Other Topics Concern  . Not on file  Social History Narrative   Drinks 1 cup of coffee a day       PHYSICAL EXAM  Vitals:   01/30/18 0835  BP: 111/67  Pulse: 74  Weight: 138 lb (62.6 kg)  Height: 5\' 1"  (1.549 m)   Body mass index is 26.07 kg/m.   MMSE - Mini Mental State Exam 01/30/2018 01/30/2018 09/18/2016  Not completed: (No Data) Unable to complete -  Orientation to time 0 - 2  Orientation to Place 0 - 2  Registration 3 - 3  Attention/ Calculation 0 - 2  Recall 0 - 0  Language- name 2 objects 0 - 2  Language- repeat 1 - 1  Language- follow 3 step command 2 - 3  Language- read & follow direction 1 - 1  Write a sentence 0 - 1  Copy design 0 - 0  Total score 7 - 17     Generalized: Well developed, in no acute distress   Neurological examination  Mentation: Alert.  Directions had to be repeated multiple times.  Demonstration also needed. Cranial nerve II-XII: . Extraocular movements were full, visual field were full on confrontational test. Facial sensation and strength were normal. Uvula tongue midline. Head turning and shoulder shrug  were normal and symmetric. Motor: The motor testing reveals 5 over 5  strength of all 4 extremities. Good symmetric motor tone is noted throughout.  Sensory: Sensory testing is intact to soft touch on all 4 extremities. No evidence of extinction is noted.  Coordination: Cerebellar testing reveals good finger-nose-finger and heel-to-shin bilaterally.  Gait and station: Gait is normal.  Reflexes: Deep tendon reflexes are symmetric and normal bilaterally.   DIAGNOSTIC DATA (LABS, IMAGING, TESTING) - I reviewed patient records, labs, notes, testing and imaging myself where available.  Lab  Results  Component Value Date   WBC 6.5 11/26/2017   HGB 11.9 (L) 11/26/2017   HCT 34.2 (L) 11/26/2017   MCV 89.5 11/26/2017   PLT 276 11/26/2017      Component Value Date/Time   NA 135 11/26/2017 1235   K 4.3 11/26/2017 1235   CL 103 11/26/2017 1235   CO2 23 11/26/2017 1235   GLUCOSE 112 (H) 11/26/2017 1235   BUN 16 11/26/2017 1235   CREATININE 1.10 (H) 11/26/2017 1235   CALCIUM 8.6 (L) 11/26/2017 1235   PROT 7.3 08/10/2016 1813   ALBUMIN 3.8 08/10/2016 1813   AST 21 08/10/2016 1813   ALT 10 (L) 08/10/2016 1813   ALKPHOS 98 08/10/2016 1813   BILITOT 0.4 08/10/2016 1813   GFRNONAA 46 (L) 11/26/2017 1235   GFRAA 54 (L) 11/26/2017 1235      ASSESSMENT AND PLAN 82 y.o. year old female  has a past medical history of Anxiety, CKD (chronic kidney disease), stage III ([place]), Dementia ([place]), GERD without esophagitis, Hyperlipemia, Major depressive disorder, single episode, unspecified, Memory difficulty, Thyroid disease, and Vitamin D deficiency. here with:  1.  Memory disturbance  The patient's memory score has declined since the last visit.  I will increase Namenda to 5 mg twice a day.  Based on the patient's lab work in August her creatinine clearance is 40.  Most likely we will not be able to increase her Namenda any further due to her kidney function.  Advised that if symptoms worsen or she develops new symptoms she should let us know.  He will follow-up in 6  months or sooner if needed.  I spent 15 minutes with the patient. 50% of this time was spent reviewing memory score   Ward Givens, MSN, NP-C 01/30/2018, 8:41 AM [facility] [address] [place], [place] [address] [phone]

## 2018-02-02 ENCOUNTER — Encounter: Payer: Self-pay | Admitting: Adult Health

## 2018-02-02 NOTE — Progress Notes (Signed)
I have read the note, and I agree with the clinical assessment and plan.  Charles K Willis   

## 2018-02-07 ENCOUNTER — Emergency Department (HOSPITAL_COMMUNITY): Payer: Medicare HMO

## 2018-02-07 ENCOUNTER — Emergency Department (HOSPITAL_COMMUNITY)
Admission: EM | Admit: 2018-02-07 | Discharge: 2018-02-07 | Disposition: A | Discharge status: Home / Self Care | Payer: Medicare HMO | Attending: Emergency Medicine | Admitting: Emergency Medicine

## 2018-02-07 ENCOUNTER — Encounter (HOSPITAL_COMMUNITY): Payer: Self-pay | Admitting: Emergency Medicine

## 2018-02-07 DIAGNOSIS — Y92481 Parking lot as the place of occurrence of the external cause: Secondary | ICD-10-CM | POA: Insufficient documentation

## 2018-02-07 DIAGNOSIS — R609 Edema, unspecified: Secondary | ICD-10-CM | POA: Diagnosis not present

## 2018-02-07 DIAGNOSIS — W101XXA Fall (on)(from) sidewalk curb, initial encounter: Secondary | ICD-10-CM | POA: Diagnosis not present

## 2018-02-07 DIAGNOSIS — Y9389 Activity, other specified: Secondary | ICD-10-CM | POA: Insufficient documentation

## 2018-02-07 DIAGNOSIS — W19XXXA Unspecified fall, initial encounter: Secondary | ICD-10-CM

## 2018-02-07 DIAGNOSIS — N183 Chronic kidney disease, stage 3 (moderate): Secondary | ICD-10-CM | POA: Diagnosis not present

## 2018-02-07 DIAGNOSIS — F0390 Unspecified dementia without behavioral disturbance: Secondary | ICD-10-CM | POA: Diagnosis not present

## 2018-02-07 DIAGNOSIS — E871 Hypo-osmolality and hyponatremia: Secondary | ICD-10-CM | POA: Diagnosis not present

## 2018-02-07 DIAGNOSIS — E039 Hypothyroidism, unspecified: Secondary | ICD-10-CM | POA: Diagnosis not present

## 2018-02-07 DIAGNOSIS — R52 Pain, unspecified: Secondary | ICD-10-CM | POA: Diagnosis not present

## 2018-02-07 DIAGNOSIS — Y999 Unspecified external cause status: Secondary | ICD-10-CM | POA: Diagnosis not present

## 2018-02-07 DIAGNOSIS — S0990XA Unspecified injury of head, initial encounter: Secondary | ICD-10-CM | POA: Diagnosis not present

## 2018-02-07 DIAGNOSIS — Z79899 Other long term (current) drug therapy: Secondary | ICD-10-CM | POA: Insufficient documentation

## 2018-02-07 DIAGNOSIS — S199XXA Unspecified injury of neck, initial encounter: Secondary | ICD-10-CM | POA: Diagnosis not present

## 2018-02-07 DIAGNOSIS — S0001XA Abrasion of scalp, initial encounter: Secondary | ICD-10-CM

## 2018-02-07 DIAGNOSIS — R410 Disorientation, unspecified: Secondary | ICD-10-CM | POA: Diagnosis not present

## 2018-02-07 LAB — CBC WITH DIFFERENTIAL/PLATELET
ABS IMMATURE GRANULOCYTES: 0.02 10*3/uL (ref 0.00–0.07)
Basophils Absolute: 0 10*3/uL (ref 0.0–0.1)
Basophils Relative: 1 %
Eosinophils Absolute: 0.1 10*3/uL (ref 0.0–0.5)
Eosinophils Relative: 1 %
HEMATOCRIT: 37.1 % (ref 36.0–46.0)
HEMOGLOBIN: 11.9 g/dL — AB (ref 12.0–15.0)
Immature Granulocytes: 0 %
LYMPHS ABS: 1.3 10*3/uL (ref 0.7–4.0)
LYMPHS PCT: 18 %
MCH: 29.8 pg (ref 26.0–34.0)
MCHC: 32.1 g/dL (ref 30.0–36.0)
MCV: 92.8 fL (ref 80.0–100.0)
MONO ABS: 0.7 10*3/uL (ref 0.1–1.0)
MONOS PCT: 10 %
Neutro Abs: 4.8 10*3/uL (ref 1.7–7.7)
Neutrophils Relative %: 70 %
Platelets: 312 10*3/uL (ref 150–400)
RBC: 4 MIL/uL (ref 3.87–5.11)
RDW: 12.3 % (ref 11.5–15.5)
WBC: 6.9 10*3/uL (ref 4.0–10.5)
nRBC: 0 % (ref 0.0–0.2)

## 2018-02-07 LAB — BASIC METABOLIC PANEL
Anion gap: 4 — ABNORMAL LOW (ref 5–15)
BUN: 14 mg/dL (ref 8–23)
CHLORIDE: 102 mmol/L (ref 98–111)
CO2: 25 mmol/L (ref 22–32)
Calcium: 8.6 mg/dL — ABNORMAL LOW (ref 8.9–10.3)
Creatinine, Ser: 1.15 mg/dL — ABNORMAL HIGH (ref 0.44–1.00)
GFR calc Af Amer: 51 mL/min — ABNORMAL LOW (ref >60)
GFR calc non Af Amer: 44 mL/min — ABNORMAL LOW (ref >60)
GLUCOSE: 116 mg/dL — AB (ref 70–99)
Potassium: 4.2 mmol/L (ref 3.5–5.1)
SODIUM: 131 mmol/L — AB (ref 135–145)

## 2018-02-07 LAB — I-STAT TROPONIN, ED: TROPONIN I, POC: 0 ng/mL (ref 0.00–0.08)

## 2018-02-07 LAB — URINALYSIS, ROUTINE W REFLEX MICROSCOPIC
BILIRUBIN URINE: NEGATIVE
GLUCOSE, UA: NEGATIVE mg/dL
HGB URINE DIPSTICK: NEGATIVE
Ketones, ur: NEGATIVE mg/dL
Leukocytes, UA: NEGATIVE
Nitrite: NEGATIVE
PH: 6 (ref 5.0–8.0)
Protein, ur: NEGATIVE mg/dL
SPECIFIC GRAVITY, URINE: 1.008 (ref 1.005–1.030)

## 2018-02-07 NOTE — ED Notes (Signed)
Ambulated pt down hall. Tolerated well.

## 2018-02-07 NOTE — ED Provider Notes (Signed)
[facility] EMERGENCY DEPARTMENT Provider Note   CSN: [id number] Arrival date & time: 02/07/18  1312     History   Chief Complaint No chief complaint on file.   HPI Jessica [name] is a 82 y.o. female.  The history is provided by the patient and a relative. No language interpreter was used.   Jessica [name] is a 82 y.o. female who presents to the Emergency Department complaining of fall. Level five caveat due to dementia. History is provided by the patient's sister. She was getting out of the car to get her hair done when she tripped over a curb and fell, striking her head. No loss of consciousness. She does report pain to her head. She denies any recent illnesses.  Past Medical History:  Diagnosis Date  . Anxiety   . CKD (chronic kidney disease), stage III ([place])   . Dementia ([place])   . GERD without esophagitis   . Hyperlipemia   . Major depressive disorder, single episode, unspecified   . Memory difficulty   . Thyroid disease   . Vitamin D deficiency     Patient Active Problem List   Diagnosis Date Noted  . Increased endometrial stripe thickness   . Hypothyroidism   . Acute lower UTI 08/11/2016  . Hyponatremia 08/11/2016  . Liver mass 08/11/2016  . Nausea and vomiting 08/10/2016    Past Surgical History:  Procedure Laterality Date  . BREAST BIOPSY Left   . CATARACT EXTRACTION, BILATERAL    . GLAUCOMA SURGERY    . HYSTEROSCOPY W/D&C N/A 02/14/2017   Procedure: DILATATION AND CURETTAGE /HYSTEROSCOPY;  Surgeon: Sanjuana Kava, MD;  Location: [facility];  Service: Gynecology;  Laterality: N/A;  . LIVER BIOPSY  2018  . WRIST SURGERY Left      OB History   None      Home Medications    Prior to Admission medications   Medication Sig Start Date End Date Taking? Authorizing Provider  Cholecalciferol (VITAMIN D3) 2000 units capsule Take 2,000 Units by mouth daily.     [provider]  esomeprazole (NEXIUM) 20 MG  capsule Take 20 mg by mouth daily at 12 noon.    [provider]  esomeprazole (NEXIUM) 40 MG capsule Take 40 mg by mouth every morning.    [provider]  levothyroxine (SYNTHROID, LEVOTHROID) 75 MCG tablet Take 75 mcg by mouth daily before breakfast.    [provider]  memantine (NAMENDA) 5 MG tablet Take 1 tablet (5 mg total) by mouth 2 (two) times daily. 01/30/18   Ward Givens, NP  pravastatin (PRAVACHOL) 40 MG tablet Take 40 mg by mouth every evening.  05/11/16   [provider]  sertraline (ZOLOFT) 50 MG tablet Take 50 mg by mouth daily. 02/24/16   [provider]    Family History Family History  Problem Relation Age of Onset  . CAD Father   . CAD Brother   . CVA Paternal Grandmother     Social History Social History   Tobacco Use  . Smoking status: Never Smoker  . Smokeless tobacco: Never Used  Substance Use Topics  . Alcohol use: No    Alcohol/week: 0.0 standard drinks  . Drug use: No     Allergies   Patient has no known allergies.   Review of Systems Review of Systems  All other systems reviewed and are negative.    Physical Exam Updated Vital Signs BP 140/78   Pulse 65  Temp 98 F (36.7 C) (Temporal)   Resp 18   SpO2 100%   Physical Exam  Constitutional: She appears well-developed and well-nourished.  HENT:  Head: Normocephalic.  Abrasion to posterior scalp  Cardiovascular: Normal rate and regular rhythm.  No murmur heard. Pulmonary/Chest: Effort normal and breath sounds normal. No respiratory distress.  Abdominal: Soft. There is no tenderness. There is no rebound and no guarding.  Musculoskeletal: She exhibits no edema or tenderness.  No hip tenderness to palpation  Neurological: She is alert.  Confused, disoriented to location and recent events.  5/5 strength in all four extremities.    Skin: Skin is warm and dry.  Psychiatric: She has a normal mood and affect. Her behavior is normal.    Nursing note and vitals reviewed.    ED Treatments / Results  Labs (all labs ordered are listed, but only abnormal results are displayed) Labs Reviewed  BASIC METABOLIC PANEL - Abnormal; Notable for the following components:      Result Value   Sodium 131 (*)    Glucose, Bld 116 (*)    Creatinine, Ser 1.15 (*)    Calcium 8.6 (*)    GFR calc non Af Amer 44 (*)    GFR calc Af Amer 51 (*)    Anion gap 4 (*)    All other components within normal limits  CBC WITH DIFFERENTIAL/PLATELET - Abnormal; Notable for the following components:   Hemoglobin 11.9 (*)    All other components within normal limits  URINE CULTURE  URINALYSIS, ROUTINE W REFLEX MICROSCOPIC  I-STAT TROPONIN, ED    EKG EKG Interpretation  Date/Time:  Friday February 07 2018 14:15:47 EDT Ventricular Rate:  70 PR Interval:    QRS Duration: 96 QT Interval:  394 QTC Calculation: 426 R Axis:   11 Text Interpretation:  Sinus rhythm Low voltage, precordial leads Confirmed by Quintella Reichert [phone]) on 02/07/2018 2:35:59 PM   Radiology Ct Head Wo Contrast  Result Date: 02/07/2018 CLINICAL DATA:  Recent chemical fall with posterior head injury, initial encounter EXAM: CT HEAD WITHOUT CONTRAST CT CERVICAL SPINE WITHOUT CONTRAST TECHNIQUE: Multidetector CT imaging of the head and cervical spine was performed following the standard protocol without intravenous contrast. Multiplanar CT image reconstructions of the cervical spine were also generated. COMPARISON:  11/10/2016 FINDINGS: CT HEAD FINDINGS Brain: Mild atrophic and chronic white matter ischemic changes are noted. No findings to suggest acute hemorrhage, acute infarction or space-occupying mass lesion are seen. Vascular: No hyperdense vessel or unexpected calcification. Skull: Normal. Negative for fracture or focal lesion. Sinuses/Orbits: The orbits are within normal limits. There is an air-fluid level noted within the left maxillary antrum is well as some mucosal  thickening. The air-fluid level may be related to the recent injury although no definitive bony abnormality is seen. Other: Scalp hematoma is noted just to the right of the midline posteriorly consistent with the recent injury. CT CERVICAL SPINE FINDINGS Alignment: Alignment is within normal limits. Skull base and vertebrae: 7 cervical segments are well visualized. Vertebral body height is well maintained. Disc space narrowing is noted at C5-6 and C6-7 with associated osteophytic changes. No acute fracture or acute facet abnormality is noted. Soft tissues and spinal canal: Surrounding soft tissues are within normal limits. Upper chest: Within normal limits. Other: None IMPRESSION: CT of the head: Chronic atrophic and ischemic changes without acute intracranial abnormality. Soft tissue scalp hematoma posteriorly as described. Air-fluid level within the left maxillary antrum likely related to the recent trauma.  No fracture is seen. CT of the cervical spine: Mild degenerative change without acute abnormality. Electronically Signed   By: Inez Catalina M.D.   On: 02/07/2018 15:36   Ct Cervical Spine Wo Contrast  Result Date: 02/07/2018 CLINICAL DATA:  Recent chemical fall with posterior head injury, initial encounter EXAM: CT HEAD WITHOUT CONTRAST CT CERVICAL SPINE WITHOUT CONTRAST TECHNIQUE: Multidetector CT imaging of the head and cervical spine was performed following the standard protocol without intravenous contrast. Multiplanar CT image reconstructions of the cervical spine were also generated. COMPARISON:  11/10/2016 FINDINGS: CT HEAD FINDINGS Brain: Mild atrophic and chronic white matter ischemic changes are noted. No findings to suggest acute hemorrhage, acute infarction or space-occupying mass lesion are seen. Vascular: No hyperdense vessel or unexpected calcification. Skull: Normal. Negative for fracture or focal lesion. Sinuses/Orbits: The orbits are within normal limits. There is an air-fluid level noted  within the left maxillary antrum is well as some mucosal thickening. The air-fluid level may be related to the recent injury although no definitive bony abnormality is seen. Other: Scalp hematoma is noted just to the right of the midline posteriorly consistent with the recent injury. CT CERVICAL SPINE FINDINGS Alignment: Alignment is within normal limits. Skull base and vertebrae: 7 cervical segments are well visualized. Vertebral body height is well maintained. Disc space narrowing is noted at C5-6 and C6-7 with associated osteophytic changes. No acute fracture or acute facet abnormality is noted. Soft tissues and spinal canal: Surrounding soft tissues are within normal limits. Upper chest: Within normal limits. Other: None IMPRESSION: CT of the head: Chronic atrophic and ischemic changes without acute intracranial abnormality. Soft tissue scalp hematoma posteriorly as described. Air-fluid level within the left maxillary antrum likely related to the recent trauma. No fracture is seen. CT of the cervical spine: Mild degenerative change without acute abnormality. Electronically Signed   By: Inez Catalina M.D.   On: 02/07/2018 15:36    Procedures Procedures (including critical care time)  Medications Ordered in ED Medications - No data to display   Initial Impression / Assessment and Plan / ED Course  I have reviewed the triage vital signs and the nursing notes.  Pertinent labs & imaging results that were available during my care of the patient were reviewed by me and considered in my medical decision making (see chart for details).     Patient here for evaluation of injuries following a fall. She has an abrasion to the posterior scalp that is hemostatic on evaluation. UA not consistent with UTI. BMP does note mild hyponatremia. She is hydrated appearing on examination. Discussed with patient and sister home care for fall, scalp abrasion. Discussed oral fluid hydration and hyponatremia. Discussed  outpatient follow-up and return precautions.  Final Clinical Impressions(s) / ED Diagnoses   Final diagnoses:  Fall, initial encounter  Abrasion of scalp, initial encounter  Hyponatremia    ED Discharge Orders    None       Quintella Reichert, MD 02/07/18 1635

## 2018-02-07 NOTE — ED Triage Notes (Signed)
Per EMS- pt is from home where she lives with family. Pt has dementia and per family she is confused at baseline. Pt has a witnessed mechanical fall, tripped and hit the back of her head. Dried blood noted to hair, wound cleaned. Small abrasion noted to the posterior head. Pt was noted to be belching several times, but when asked if she was nauseated she states "I don't know what that means."

## 2018-02-08 LAB — URINE CULTURE: Culture: NO GROWTH

## 2018-02-10 DIAGNOSIS — E871 Hypo-osmolality and hyponatremia: Secondary | ICD-10-CM | POA: Diagnosis not present

## 2018-02-10 DIAGNOSIS — T148XXA Other injury of unspecified body region, initial encounter: Secondary | ICD-10-CM | POA: Diagnosis not present

## 2018-02-10 DIAGNOSIS — S0003XD Contusion of scalp, subsequent encounter: Secondary | ICD-10-CM | POA: Diagnosis not present

## 2018-02-25 DIAGNOSIS — E871 Hypo-osmolality and hyponatremia: Secondary | ICD-10-CM | POA: Diagnosis not present

## 2018-03-10 ENCOUNTER — Other Ambulatory Visit: Payer: Self-pay | Admitting: Neurology

## 2018-03-13 DIAGNOSIS — Z961 Presence of intraocular lens: Secondary | ICD-10-CM | POA: Diagnosis not present

## 2018-03-13 DIAGNOSIS — H4322 Crystalline deposits in vitreous body, left eye: Secondary | ICD-10-CM | POA: Diagnosis not present

## 2018-03-13 DIAGNOSIS — H52203 Unspecified astigmatism, bilateral: Secondary | ICD-10-CM | POA: Diagnosis not present

## 2018-03-13 DIAGNOSIS — H43813 Vitreous degeneration, bilateral: Secondary | ICD-10-CM | POA: Diagnosis not present

## 2018-07-24 ENCOUNTER — Telehealth: Payer: Self-pay | Admitting: *Deleted

## 2018-07-24 NOTE — Telephone Encounter (Signed)
I called sister of pt.   Pt having an off day today.   Having issues with going to bathroom a lot today.   No diarrhea. Has had accidents previously in her depends.  (both stool and urine).  I questioned if uti?? Frequency, fever.  No she did not think she had fever.  Hydrated?  Encouraged drinking fluids. Can have confusion with UTI.  If continues, call pcp. She stated had appt with her pcp 08-11-18.  Pt also not taking her medications like she should, refuses to take, she already did try applesauce and pt fights this.   I relayed with dementia that she may try again later and she may take this 5 minutes later.  She was ok to hold off on appt since covid 19 and getting out.  Will place on waitlist to make appt later when safer to get out, but if questions to call us back. She understood.

## 2018-08-04 NOTE — Telephone Encounter (Signed)
I called sister, Jessica [name] who is her caregiver.  Pt is about same.  Not taking namenda.   I offered telephone visit for 6 month f/u, reschedule from 08-12-18 MM/NP.  She did consent to telephone visit, and relayed that will be billed to insurance as visit.   She verbalized understanding.   Pts sister, Jessica [name] understands that although there may be some limitations with this type of visit, we will take all precautions to reduce any security or privacy concerns.  Pts sister, Jessica [name] understands that this will be treated like an in office visit and we will file with pt's insurance.

## 2018-08-07 ENCOUNTER — Ambulatory Visit (INDEPENDENT_AMBULATORY_CARE_PROVIDER_SITE_OTHER): Payer: Medicare HMO | Admitting: Family Medicine

## 2018-08-07 ENCOUNTER — Encounter: Payer: Self-pay | Admitting: Family Medicine

## 2018-08-07 ENCOUNTER — Other Ambulatory Visit: Payer: Self-pay

## 2018-08-07 DIAGNOSIS — F0390 Unspecified dementia without behavioral disturbance: Secondary | ICD-10-CM

## 2018-08-07 NOTE — Progress Notes (Addendum)
PATIENT: Jessica [name] DOB: 08/01/1935  REASON FOR VISIT: follow up HISTORY FROM: patient  Virtual Visit via Telephone Note  I connected with Jessica [name] on 08/07/18 at 11:00 AM EDT by telephone and verified that I am speaking with the correct person using two identifiers.   I discussed the limitations, risks, security and privacy concerns of performing an evaluation and management service by telephone and the availability of in person appointments. I also discussed with the patient that there may be a patient responsible charge related to this service. The patient expressed understanding and agreed to proceed.   History of Present Illness:  08/07/18 Jessica [name] is a 83 y.o. female for follow up of dementia.  Patient is asleep during teleconference and history is given by her sister, [name], who is her primary caregiver and POA.  [name] reports that Jessica [name] is not doing as well over the past 2 or 3 months.  She states that she has a decreased appetite and is very difficult to get her medications and her.  [name] reports that she is able to give her levothyroxine and sertraline on a daily basis.  They have stopped Namenda due to concerns of diarrhea.  She has tried Aricept in the past and was not able to tolerate it.  [name] does not feel that Jessica [name] would benefit from adding medications at this time.  [name] has reached out to her family for assistance in caring for Ms. [name].  [name] has 2 sister in-laws who live behind and across from her.  Her sister has recently moved to [place] to be closer.  She also has a niece who is a CNA and is helping care for her Jessica [name].  She is unable to complete ADLs independently.  She does require assistance with bathing and dressing.  She does not drive.  She does not cook.  She is doing well otherwise.  She has follow-up with her primary care provider on 08/11/2018.  Last fall was in November, 2019.   HISTORY (copied from  [facility] note on 01/30/2018)  Jessica [name] is a 83 year old female with a history of memory disturbance.  She returns today for follow-up.  She is here today with her sister.  The patient was started on Namenda 5 mg daily.  Sister reports that she tolerates it well.  She lives with her sister.  She requires assistance with ADLs.  She does not operate a motor vehicle.  Her sister reports that she has a good appetite.  Denies any trouble sleeping.  Her sister does feel that her memory has declined frequently since the last visit.  She returns today for evaluation.  HISTORY 08/19/2017(Copied from Dr. Guadelupe Sabin note): She reports very little on her own today. Unfortunately, she had a recent fall last month. Have reviewed the emergency room records from 07/23/2017. She sustained of right-sided distal radius fracture. She is being treated conservatively with a splint. She saw Dr. Fredna Dow in hand surgery for this, has an appointment this week for follow-up.  Her sister reports that she is declining memory wise. She keeps asking the same questions over and over again and needs redirection for even simple tasks.Sister has some help from extended family members that live close by, including to his sister-in-laws and a cousin.  The patient's allergies, current medications, family history, past medical history, past social history, past surgical history and problem list were reviewed and updated as appropriate.   Observations/Objective:  Unable to complete  assessment as patient is asleep during teleconference.   Assessment and Plan:  83 y.o. year old female  has a past medical history of Anxiety, CKD (chronic kidney disease), stage III ([place]), Dementia ([place]), GERD without esophagitis, Hyperlipemia, Major depressive disorder, single episode, unspecified, Memory difficulty, Thyroid disease, and Vitamin D deficiency. here with    ICD-10-CM   1. Dementia without behavioral disturbance, unspecified  dementia type [facility]) F03.90    Mrs Gaertner seems to be fairly stable from a dementia standpoint, however, as had some decreased appetite over the last 2 to 3 months.  [name], her caregiver, does not want to add any additional medications at this time.  She does continue to give her levothyroxine as well as sertraline.  She also continues regular follow-up with her primary care provider.  She has scheduled follow-up on 08/11/2018.  We will continue monitoring for now.  With advised to reach out for any additional concerns or worsening of symptoms.  I recommend a six-month follow-up.  She verbalizes understanding and agreement with this plan.  No orders of the defined types were placed in this encounter.   No orders of the defined types were placed in this encounter.    Follow Up Instructions:  I discussed the assessment and treatment plan with the patient. The patient was provided an opportunity to ask questions and all were answered. The patient agreed with the plan and demonstrated an understanding of the instructions.   The patient was advised to call back or seek an in-person evaluation if the symptoms worsen or if the condition fails to improve as anticipated.  I provided 35 minutes of non-face-to-face time during this encounter.  Patient and her caregiver [name] are located at their place of residence during teleconference.  Patient is asleep during visit and history was given by [name].  They are unable to complete video conference due to not having a cell phone or computer.  Provider is located at her place of residence during teleconference.  Liane Comber, RN helped to facilitate visit.   Debbora Presto, NP   I reviewed the above note and documentation by the Nurse Practitioner and agree with the history, assessment and plan as outlined above. I was immediately available for phone consultation. Star Age, MD, PhD [facility])

## 2018-08-11 DIAGNOSIS — M810 Age-related osteoporosis without current pathological fracture: Secondary | ICD-10-CM | POA: Diagnosis not present

## 2018-08-11 DIAGNOSIS — F325 Major depressive disorder, single episode, in full remission: Secondary | ICD-10-CM | POA: Diagnosis not present

## 2018-08-11 DIAGNOSIS — E039 Hypothyroidism, unspecified: Secondary | ICD-10-CM | POA: Diagnosis not present

## 2018-08-11 DIAGNOSIS — F419 Anxiety disorder, unspecified: Secondary | ICD-10-CM | POA: Diagnosis not present

## 2018-08-11 DIAGNOSIS — F0390 Unspecified dementia without behavioral disturbance: Secondary | ICD-10-CM | POA: Diagnosis not present

## 2018-08-12 ENCOUNTER — Ambulatory Visit: Payer: Medicare HMO | Admitting: Adult Health

## 2018-08-27 ENCOUNTER — Telehealth: Payer: Self-pay

## 2018-08-27 NOTE — Telephone Encounter (Signed)
Follow up has been scheduled. Patient is aware of appt day and time.   

## 2019-02-05 ENCOUNTER — Ambulatory Visit: Payer: Self-pay | Admitting: Family Medicine

## 2019-02-06 ENCOUNTER — Ambulatory Visit: Payer: Self-pay | Admitting: Family Medicine

## 2019-03-04 ENCOUNTER — Emergency Department (HOSPITAL_COMMUNITY)
Admission: EM | Admit: 2019-03-04 | Discharge: 2019-03-05 | Disposition: A | Discharge status: Home / Self Care | Payer: Medicare HMO | Attending: Emergency Medicine | Admitting: Emergency Medicine

## 2019-03-04 ENCOUNTER — Emergency Department (HOSPITAL_COMMUNITY): Payer: Medicare HMO

## 2019-03-04 ENCOUNTER — Encounter (HOSPITAL_COMMUNITY): Payer: Self-pay

## 2019-03-04 DIAGNOSIS — E039 Hypothyroidism, unspecified: Secondary | ICD-10-CM | POA: Insufficient documentation

## 2019-03-04 DIAGNOSIS — S0990XA Unspecified injury of head, initial encounter: Secondary | ICD-10-CM | POA: Diagnosis not present

## 2019-03-04 DIAGNOSIS — M4312 Spondylolisthesis, cervical region: Secondary | ICD-10-CM | POA: Diagnosis not present

## 2019-03-04 DIAGNOSIS — F0390 Unspecified dementia without behavioral disturbance: Secondary | ICD-10-CM

## 2019-03-04 DIAGNOSIS — N390 Urinary tract infection, site not specified: Secondary | ICD-10-CM | POA: Insufficient documentation

## 2019-03-04 DIAGNOSIS — F329 Major depressive disorder, single episode, unspecified: Secondary | ICD-10-CM | POA: Diagnosis not present

## 2019-03-04 DIAGNOSIS — G319 Degenerative disease of nervous system, unspecified: Secondary | ICD-10-CM | POA: Diagnosis not present

## 2019-03-04 DIAGNOSIS — F0280 Dementia in other diseases classified elsewhere without behavioral disturbance: Secondary | ICD-10-CM | POA: Diagnosis not present

## 2019-03-04 DIAGNOSIS — R079 Chest pain, unspecified: Secondary | ICD-10-CM | POA: Diagnosis present

## 2019-03-04 DIAGNOSIS — R52 Pain, unspecified: Secondary | ICD-10-CM | POA: Diagnosis not present

## 2019-03-04 DIAGNOSIS — R404 Transient alteration of awareness: Secondary | ICD-10-CM | POA: Diagnosis not present

## 2019-03-04 DIAGNOSIS — R0789 Other chest pain: Secondary | ICD-10-CM | POA: Diagnosis not present

## 2019-03-04 DIAGNOSIS — R0781 Pleurodynia: Secondary | ICD-10-CM | POA: Diagnosis not present

## 2019-03-04 DIAGNOSIS — I10 Essential (primary) hypertension: Secondary | ICD-10-CM | POA: Diagnosis not present

## 2019-03-04 DIAGNOSIS — Z79899 Other long term (current) drug therapy: Secondary | ICD-10-CM | POA: Diagnosis not present

## 2019-03-04 DIAGNOSIS — F419 Anxiety disorder, unspecified: Secondary | ICD-10-CM | POA: Diagnosis not present

## 2019-03-04 DIAGNOSIS — S199XXA Unspecified injury of neck, initial encounter: Secondary | ICD-10-CM | POA: Diagnosis not present

## 2019-03-04 DIAGNOSIS — R000 Tachycardia, unspecified: Secondary | ICD-10-CM | POA: Diagnosis not present

## 2019-03-04 DIAGNOSIS — M50323 Other cervical disc degeneration at C6-C7 level: Secondary | ICD-10-CM | POA: Diagnosis not present

## 2019-03-04 DIAGNOSIS — N183 Chronic kidney disease, stage 3 unspecified: Secondary | ICD-10-CM | POA: Diagnosis not present

## 2019-03-04 DIAGNOSIS — M4313 Spondylolisthesis, cervicothoracic region: Secondary | ICD-10-CM | POA: Diagnosis not present

## 2019-03-04 LAB — BASIC METABOLIC PANEL
Anion gap: 9 (ref 5–15)
BUN: 13 mg/dL (ref 8–23)
CO2: 23 mmol/L (ref 22–32)
Calcium: 8.6 mg/dL — ABNORMAL LOW (ref 8.9–10.3)
Chloride: 103 mmol/L (ref 98–111)
Creatinine, Ser: 1.1 mg/dL — ABNORMAL HIGH (ref 0.44–1.00)
GFR calc Af Amer: 55 mL/min — ABNORMAL LOW (ref >60)
GFR calc non Af Amer: 47 mL/min — ABNORMAL LOW (ref >60)
Glucose, Bld: 108 mg/dL — ABNORMAL HIGH (ref 70–99)
Potassium: 4.4 mmol/L (ref 3.5–5.1)
Sodium: 135 mmol/L (ref 135–145)

## 2019-03-04 LAB — CBC WITH DIFFERENTIAL/PLATELET
Abs Immature Granulocytes: 0.02 10*3/uL (ref 0.00–0.07)
Basophils Absolute: 0 10*3/uL (ref 0.0–0.1)
Basophils Relative: 0 %
Eosinophils Absolute: 0 10*3/uL (ref 0.0–0.5)
Eosinophils Relative: 0 %
HCT: 34.8 % — ABNORMAL LOW (ref 36.0–46.0)
Hemoglobin: 11.8 g/dL — ABNORMAL LOW (ref 12.0–15.0)
Immature Granulocytes: 0 %
Lymphocytes Relative: 20 %
Lymphs Abs: 1.3 10*3/uL (ref 0.7–4.0)
MCH: 31.3 pg (ref 26.0–34.0)
MCHC: 33.9 g/dL (ref 30.0–36.0)
MCV: 92.3 fL (ref 80.0–100.0)
Monocytes Absolute: 0.6 10*3/uL (ref 0.1–1.0)
Monocytes Relative: 9 %
Neutro Abs: 4.7 10*3/uL (ref 1.7–7.7)
Neutrophils Relative %: 71 %
Platelets: 292 10*3/uL (ref 150–400)
RBC: 3.77 MIL/uL — ABNORMAL LOW (ref 3.87–5.11)
RDW: 12.2 % (ref 11.5–15.5)
WBC: 6.6 10*3/uL (ref 4.0–10.5)
nRBC: 0 % (ref 0.0–0.2)

## 2019-03-04 LAB — URINALYSIS, ROUTINE W REFLEX MICROSCOPIC
Bilirubin Urine: NEGATIVE
Glucose, UA: NEGATIVE mg/dL
Ketones, ur: NEGATIVE mg/dL
Nitrite: POSITIVE — AB
Protein, ur: NEGATIVE mg/dL
Specific Gravity, Urine: 1.006 (ref 1.005–1.030)
WBC, UA: >50 WBC/hpf — ABNORMAL HIGH (ref 0–5)
pH: 6 (ref 5.0–8.0)

## 2019-03-04 LAB — TSH: TSH: 3.537 u[IU]/mL (ref 0.350–4.500)

## 2019-03-04 LAB — TROPONIN I (HIGH SENSITIVITY): Troponin I (High Sensitivity): 4 ng/L (ref <18)

## 2019-03-04 MED ORDER — CEPHALEXIN 500 MG PO CAPS: 500.0000 mg | ORAL_CAPSULE | Freq: Two times a day (BID) | ORAL | 0 refills | Status: AC

## 2019-03-04 MED ORDER — CEPHALEXIN 500 MG PO CAPS
500.0000 mg | ORAL_CAPSULE | Freq: Once | ORAL | Status: AC
  Administered 2019-03-04: 500 mg via ORAL
  Filled 2019-03-04: qty 1

## 2019-03-04 NOTE — ED Notes (Signed)
[facility] called for patient transport home.  

## 2019-03-04 NOTE — ED Notes (Signed)
Patient consistently taking off leads, BP cuff, and pulse ox. Patient redirected and distracted to perform EKG and VS.    Per ems visitor is on the way.

## 2019-03-04 NOTE — ED Notes (Addendum)
Visitor (Sister) at bedside. States she is the legal guardian. Sister states she was not able to change patients soiled brief.   Pure wick placed on patient. Patient encouraged to void.

## 2019-03-04 NOTE — Evaluation (Signed)
Physical Therapy Evaluation: 1 x EVAL  Patient Details Name: Jessica [name] MRN: [id number] DOB: May 16, 1935 Today's Date: 03/04/2019   History of Present Illness  Pt 83 year old female brought to ED by sister whom the patient lives with due to patients increased inability to mobilize and follow simple commands in order for the sister to assist in caring for her. They have had 1 recent fall together while attempting to stand patient up at the edge of the bed, however sister reports pt was ambulating in the home last night and she could not get her to rest adn sit down. sister voiced a definite concern for caring for the pateint safely at home at this time due to the progression of dementia or Alzheimers per pt sisters report. She did also ntoice patient had been complaining of pain upon palpation to her sternum area.    Clinical Impression  Pt with no spoken words during session, gathered all information from pt's sister at her bedside.Please review all comments and descriptions throughout this assessment for full details. Was able to help assist patient with mobility at bedside and with ambulation, however this was guided by 2 person assist and sounds like this has become more inconsistent and challenging for pt's sister to handle at home. For further PT needed at this time, feel pt would benefit from memory care where caregivers can assist and guide patient as others with dementia. Thank you for your consult.     Follow Up Recommendations Supervision/Assistance - 24 hour;Other (comment)(with pt's dementia feel she would be best in a memory care unit with full care and mobility by staff. Do not feel pt is a good candidate for skilled PT due to her inability to follow commands and make progression with her dementia state.)    Equipment Recommendations       Recommendations for Other Services       Precautions / Restrictions Precautions Precautions: Fall      Mobility  Bed  Mobility Overal bed mobility: Needs Assistance Bed Mobility: Supine to Sit;Sit to Supine     Supine to sit: +2 for physical assistance Sit to supine: +2 for physical assistance   General bed mobility comments: pt with adducted extremities and somewhat flexed during movment from supine to sit and sit to supine. Slowly guided with 2 person assist to decrease fear and anxiety for dementia pateint.  Transfers Overall transfer level: Needs assistance Equipment used: 2 person hand held assist Transfers: Sit to/from Stand Sit to Stand: +2 physical assistance;Mod assist         General transfer comment: tactile, verbal and sister in front to help encourage sit to stand. Initially with psoterior lean for fear of falling and unsurity of environment, however with gradual movment with a few weight shifts pt with more center of gravity and less assist needed.  Ambulation/Gait Ambulation/Gait assistance: Mod assist;+2 physical assistance Gait Distance (Feet): 20 Feet(min to mod) Assistive device: 2 person hand held assist Gait Pattern/deviations: Narrow base of support     General Gait Details: narrow base of support, small steps , initial posterior lean. Initiated tactile and guided cues for weight shifting to stimulating stepping needed for gait.  Stairs            Wheelchair Mobility    Modified Rankin (Stroke Patients Only)       Balance Overall balance assessment: Needs assistance;History of Falls   Sitting balance-Leahy Scale: Good   Postural control: Posterior lean Standing balance support:  During functional activity;Bilateral upper extremity supported Standing balance-Leahy Scale: Fair                               Pertinent Vitals/Pain Pain Assessment: Faces Faces Pain Scale: Hurts little more Pain Location: when sister palpated her right side of her sternum. Sister reported that she has let the MD know about this.    Home Living Family/patient  expects to be discharged to:: Private residence Living Arrangements: Other (Comment)(her sister) Available Help at Discharge: Family Type of Home: House           Additional Comments: Unclear of the home set up , did not go alot into this, pt's sister was having great difficulty hearing /understanding me, so the focus what on other questions at this time.    Prior Function Level of Independence: Needs assistance         Comments: From gathering facts from the patient's sister. Pt has had a more progressive deline, eating less, non intelligible words, difficulty at times getting her to follow simple gestures commands for daily mobility and care. However at times, pt does get up and walk holding to things along the way. Sister reports pt screams out more so recently than she has before as well.     Hand Dominance        Extremity/Trunk Assessment        Lower Extremity Assessment Lower Extremity Assessment: Overall WFL for tasks assessed(holds all extremities in a guarded flexed and adducted pattern ( usual for moderate/ advanced dementia) . These pattern were able to be released with gentle guided movment in fucntional mobility. No gross flexion contractures oted at this time.)       Communication   Communication: Other (comment)(pt is unable to communicate or respond. She did make eye contact and responded to guided movement for mobility.)  Cognition Arousal/Alertness: Awake/alert Behavior During Therapy: Flat affect(she did responded to guided movement for all mobility.) Overall Cognitive Status: Impaired/Different from baseline                                 General Comments: Sister reports that her dementia/ alzheimers has gotten worse      General Comments      Exercises     Assessment/Plan    PT Assessment Patent does not need any further PT services  PT Problem List         PT Treatment Interventions      PT Goals (Current goals can be  found in the Care Plan section)  Acute Rehab PT Goals PT Goal Formulation: All assessment and education complete, DC therapy    Frequency     Barriers to discharge        Co-evaluation               AM-PAC PT "6 Clicks" Mobility  Outcome Measure Help needed turning from your back to your side while in a flat bed without using bedrails?: A Lot Help needed moving from lying on your back to sitting on the side of a flat bed without using bedrails?: A Lot Help needed moving to and from a bed to a chair (including a wheelchair)?: A Lot Help needed standing up from a chair using your arms (e.g., wheelchair or bedside chair)?: A Lot Help needed to walk in hospital room?: A Lot Help needed climbing  3-5 steps with a railing? : Total 6 Click Score: 11    End of Session   Activity Tolerance: Patient tolerated treatment well;Other (comment)(limited due to pt's cognitive status of dementia) Patient left: in bed;with family/visitor present   PT Visit Diagnosis: Other abnormalities of gait and mobility (R26.89)    Time: [id number] PT Time Calculation (min) (ACUTE ONLY): 20 min   Charges:   PT Evaluation $PT Eval Low Complexity: 1 Low          Clide Dales, PT Acute Rehabilitation Services Pager: [phone] Office: [phone] 03/04/2019   Clide Dales 03/04/2019, 3:26 PM

## 2019-03-04 NOTE — ED Notes (Signed)
Called PT and the office said they would page out to one of the therapists to pick up the order for this patient for PT.

## 2019-03-04 NOTE — TOC Initial Note (Signed)
Transition of Care [facility]) - Initial/Assessment Note    Patient Details  Name: Jessica [name] MRN: [id number] Date of Birth: 05-29-1935  [facility]) CM/SW Contact:    Erenest Rasher, RN Phone Number: [phone] 03/04/2019, 6:09 PM  Clinical Narrative:                 TOC CM spoke to pt's sister, Shirlee Limerick. Explained results from PT evaluation and that SNF rehab was not recommended. States they cannot afford to pay for SNF out of pocket. States her sister, [name] lives in the home and is there 24 hours. States she will assist with care but they need a long term option. Explained [facility] SW can work with family and PCP with getting her to ALF/Memory Care. States this is out of pocket or Medicaid will cover. Family will have to apply for Medicaid and usually pay for initial few months until she is approved. Offered choice for [facility] and agreeable to [place]. Explained due to holiday that DME and [place] may start on Monday. Sister verbalized understanding. Faxed referral for DME to [place] [facility].   Expected Discharge Plan: Skilled Nursing Facility Barriers to Discharge: Continued Medical Work up   Patient Goals and CMS Choice Patient states their goals for this hospitalization and ongoing recovery are:: wants her to have some rehab CMS Medicare.gov Compare Post Acute Care list provided to:: Patient Represenative (must comment)(Evelyn Lininger) Choice offered to / list presented to : [place] / Guardian  Expected Discharge Plan and Services Expected Discharge Plan: [place] In-house Referral: Clinical Social Work Discharge Planning Services: CM Consult Post Acute Care Choice: [place] Living arrangements for the past 2 months: [place]                 DME Arranged: 3-N-1, Hospital bed, Wheelchair manual DME Agency: [place] Date DME Agency Contacted: 03/04/19     HH Arranged: RN, PT, OT, Nurse's Aide, Social Work [employer] Agency: [facility] Agency Contacted: 03/04/19 Time [place]: 1809    Prior Living Arrangements/Services Living arrangements for the past 2 months: [place] Lives with:: Siblings Patient language and need for interpreter reviewed:: Yes Do you feel safe going back to the place where you live?: No   she needs to be able to walk and move around  Need for Family Participation in Patient Care: Yes (Comment) Care giver support system in place?: No (comment)   Criminal Activity/Legal Involvement Pertinent to Current Situation/Hospitalization: No - Comment as needed  Activities of Daily Living      Permission Sought/Granted Permission sought to share information with : Case Manager, Facility Sport and exercise psychologist, PCP, Family Supports Permission granted to share information with : Yes, Verbal Permission Granted  Share Information with NAME: Ninfa Lind[place], [place] granted to share info w AGENCY: SNF, [place] granted to share info w Relationship: sisters  Permission granted to share info w Contact Information: [phone], [phone]  Emotional Assessment Appearance:: Appears stated age Attitude/Demeanor/Rapport: Other (comment)(Quiet, Calm) Affect (typically observed): Calm Orientation: : Oriented to Self   Psych Involvement: No (comment)  Admission diagnosis:  fall rib pain Patient Active Problem List   Diagnosis Date Noted  . Increased endometrial stripe thickness   . Hypothyroidism   . Acute lower UTI 08/11/2016  . Hyponatremia 08/11/2016  . Liver mass 08/11/2016  . Nausea and vomiting 08/10/2016   PCP:  Harlan Stains,  MD Pharmacy:   [place], [address] [address] Phone: [phone] Fax: [phone]  [id number] [id number] [place] [address] Phone: [phone] Fax:  [phone]     Social Determinants of Health (SDOH) Interventions    Readmission Risk Interventions No flowsheet data found.

## 2019-03-04 NOTE — TOC Initial Note (Signed)
Transition of Care [facility]) - Initial/Assessment Note    Patient Details  Name: Jessica [name] MRN: [id number] Date of Birth: [birth year]  Transition of [facility]) CM/SW Contact:    Erenest Rasher, RN Phone Number: [phone] 03/04/2019, 1:52 PM  Clinical Narrative:                 Spoke to pt's, sisters, Jessica [name] (at bedside) and Jessica [name] (by phone). States they are requesting SNF rehab. States she was up walking prior to her fall on 03/02/2019. She has no DME at home. Sister states she can borrow a Systems analyst. Gave permission to create FL2 and fax referral to SNF. Offered choice for [facility], agreeable to [facility], or [place]. CSW referral for SNF placement. Waiting PT recommendations.   Expected Discharge Plan: Skilled Nursing Facility Barriers to Discharge: Continued Medical Work up   Patient Goals and CMS Choice Patient states their goals for this hospitalization and ongoing recovery are:: wants her to have some rehab CMS Medicare.gov Compare Post Acute Care list provided to:: Patient Represenative (must comment)(Jessica [name]) Choice offered to / list presented to : [place] / Guardian  Expected Discharge Plan and Services Expected Discharge Plan: [place] In-house Referral: Clinical Social Work Discharge Planning Services: CM Consult Post Acute Care Choice: [place] arrangements for the past 2 months: [place]                                      Prior Living Arrangements/Services Living arrangements for the past 2 months: Single Family Home Lives with:: Siblings Patient language and need for interpreter reviewed:: Yes Do you feel safe going back to the place where you live?: No   she needs to be able to walk and move around  Need for Family Participation in Patient Care: Yes (Comment) Care giver support system in place?: No (comment)   Criminal Activity/Legal Involvement Pertinent to Current  Situation/Hospitalization: No - Comment as needed  Activities of Daily Living      Permission Sought/Granted Permission sought to share information with : Case Manager, Facility Sport and exercise psychologist, PCP, Family Supports Permission granted to share information with : Yes, Verbal Permission Granted  Share Information with NAME: Jessica [name], [place] granted to share info w AGENCY: SNF, [place] granted to share info w Relationship: sisters  Permission granted to share info w Contact Information: [phone], [phone]  Emotional Assessment Appearance:: Appears stated age Attitude/Demeanor/Rapport: Other (comment)(Quiet, Calm) Affect (typically observed): Calm Orientation: : Oriented to Self   Psych Involvement: No (comment)  Admission diagnosis:  fall rib pain Patient Active Problem List   Diagnosis Date Noted  . Increased endometrial stripe thickness   . Hypothyroidism   . Acute lower UTI 08/11/2016  . Hyponatremia 08/11/2016  . Liver mass 08/11/2016  . Nausea and vomiting 08/10/2016   PCP:  Harlan Stains, MD Pharmacy:   [place], [place] [address] Phone: [phone] Fax: [phone]  [id number] [address], [place] [address] Phone: [phone] Fax: [phone]     Social Determinants of Health (SDOH) Interventions    Readmission Risk Interventions No flowsheet data found.

## 2019-03-04 NOTE — ED Notes (Signed)
Physical therapy at bedside

## 2019-03-04 NOTE — ED Provider Notes (Signed)
[facility] DEPT Provider Note   CSN: [id number] Arrival date & time: 03/04/19  1204     History   Chief Complaint Chief Complaint  Patient presents with   Chest Pain    HPI Jessica [name] is a 84 y.o. female.     Level 5 caveat due to dementia.  History is provided by EMS.  Patient with a fall 2 days ago.  They were called out for right-sided chest pain.  Had some right-sided chest tenderness.  Patient lives at home with her sisters who are patient's primary caregiver.  She is unable to participate in history.  EMS states that family was mostly concerned about possible traumatic injuries.  Primary care physician told patient to be evaluated in the ED.  They state that patient was grabbing at her chest.  Has no history of cardiac disease.  The history is provided by the EMS personnel.  Illness Location:  Chest Severity:  Mild Onset quality:  Gradual   Past Medical History:  Diagnosis Date   Anxiety    CKD (chronic kidney disease), stage III    Dementia (HCC)    GERD without esophagitis    Hyperlipemia    Major depressive disorder, single episode, unspecified    Memory difficulty    Thyroid disease    Vitamin D deficiency     Patient Active Problem List   Diagnosis Date Noted   Increased endometrial stripe thickness    Hypothyroidism    Acute lower UTI 08/11/2016   Hyponatremia 08/11/2016   Liver mass 08/11/2016   Nausea and vomiting 08/10/2016    Past Surgical History:  Procedure Laterality Date   BREAST BIOPSY Left    CATARACT EXTRACTION, BILATERAL     GLAUCOMA SURGERY     HYSTEROSCOPY W/D&C N/A 02/14/2017   Procedure: DILATATION AND CURETTAGE /HYSTEROSCOPY;  Surgeon: Sanjuana Kava, MD;  Location: [facility];  Service: Gynecology;  Laterality: N/A;   LIVER BIOPSY  2018   WRIST SURGERY Left      OB History   No obstetric history on file.      Home Medications    Prior to  Admission medications   Medication Sig Start Date End Date Taking? Authorizing Provider  Cholecalciferol (VITAMIN D3) 2000 units capsule Take 2,000 Units by mouth daily.     [provider]  esomeprazole (NEXIUM) 20 MG capsule Take 20 mg by mouth daily at 12 noon.    [provider]  esomeprazole (NEXIUM) 40 MG capsule Take 40 mg by mouth every morning.    [provider]  levothyroxine (SYNTHROID, LEVOTHROID) 75 MCG tablet Take 75 mcg by mouth daily before breakfast.    [provider]  memantine (NAMENDA) 5 MG tablet Take 1 tablet (5 mg total) by mouth 2 (two) times daily. Patient not taking: Reported on 08/05/2018 01/30/18   Ward Givens, NP  pravastatin (PRAVACHOL) 40 MG tablet Take 40 mg by mouth every evening.  05/11/16   [provider]  sertraline (ZOLOFT) 50 MG tablet Take 50 mg by mouth daily. 02/24/16   [provider]    Family History Family History  Problem Relation Age of Onset   CAD Father    CAD Brother    CVA Paternal Grandmother     Social History Social History   Tobacco Use   Smoking status: Never Smoker   Smokeless tobacco: Never Used  Substance Use Topics   Alcohol use: No    Alcohol/week:  0.0 standard drinks   Drug use: No     Allergies   Patient has no known allergies.   Review of Systems Review of Systems  Unable to perform ROS: Dementia     Physical Exam Updated Vital Signs  ED Triage Vitals [03/04/19 1208]  Enc Vitals Group     BP (!) 137/114     Pulse Rate 82     Resp 18     Temp 98.8 F (37.1 C)     Temp Source Axillary     SpO2 100 %     Weight      Height      Head Circumference      Peak Flow      Pain Score      Pain Loc      Pain Edu?      Excl. in [place]?     Physical Exam Vitals signs and nursing note reviewed.  Constitutional:      General: She is not in acute distress.    Appearance: She is well-developed.  HENT:     Head: Normocephalic and atraumatic.   Eyes:     Extraocular Movements: Extraocular movements intact.     Conjunctiva/sclera: Conjunctivae normal.     Pupils: Pupils are equal, round, and reactive to light.  Neck:     Musculoskeletal: Normal range of motion and neck supple.  Cardiovascular:     Rate and Rhythm: Normal rate and regular rhythm.     Pulses:          Radial pulses are 2+ on the right side and 2+ on the left side.       Dorsalis pedis pulses are 2+ on the right side and 2+ on the left side.     Heart sounds: Normal heart sounds. No murmur.  Pulmonary:     Effort: Pulmonary effort is normal. No respiratory distress.     Breath sounds: Normal breath sounds. No decreased breath sounds or wheezing.  Chest:     Chest wall: Tenderness (right side of chest wall but difficult to tell) present.  Abdominal:     Palpations: Abdomen is soft.     Tenderness: There is no abdominal tenderness.  Skin:    General: Skin is warm and dry.  Neurological:     General: No focal deficit present.     Mental Status: She is alert.     Comments: Patient pleasantly demented/mildly agitated      ED Treatments / Results  Labs (all labs ordered are listed, but only abnormal results are displayed) Labs Reviewed  CBC WITH DIFFERENTIAL/PLATELET - Abnormal; Notable for the following components:      Result Value   RBC 3.77 (*)    Hemoglobin 11.8 (*)    HCT 34.8 (*)    All other components within normal limits  BASIC METABOLIC PANEL - Abnormal; Notable for the following components:   Glucose, Bld 108 (*)    Creatinine, Ser 1.10 (*)    Calcium 8.6 (*)    GFR calc non Af Amer 47 (*)    GFR calc Af Amer 55 (*)    All other components within normal limits  URINALYSIS, ROUTINE W REFLEX MICROSCOPIC  TSH  T4  TROPONIN I (HIGH SENSITIVITY)    EKG EKG Interpretation  Date/Time:  Wednesday March 04 2019 12:25:58 EST Ventricular Rate:  99 PR Interval:    QRS Duration: 96 QT Interval:  374 QTC Calculation: 445 R  Axis:  43 Text Interpretation: Sinus rhythm Ventricular tachycardia, unsustained Confirmed by Lennice Sites [phone]) on 03/04/2019 12:46:36 PM   Radiology Ct Head Wo Contrast  Result Date: 03/04/2019 CLINICAL DATA:  C-spine trauma, high clinical risk (NEXUS/CCR); Head trauma, minor, GCS>=13, high clinical risk, initial exam Dementia patient post fall today. EXAM: CT HEAD WITHOUT CONTRAST CT CERVICAL SPINE WITHOUT CONTRAST TECHNIQUE: Multidetector CT imaging of the head and cervical spine was performed following the standard protocol without intravenous contrast. Multiplanar CT image reconstructions of the cervical spine were also generated. COMPARISON:  Head and cervical spine CT 02/07/2018 FINDINGS: CT HEAD FINDINGS Brain: No intracranial hemorrhage, mass effect, or midline shift. Age related atrophy with mild chronic small vessel ischemia, unchanged. No hydrocephalus. The basilar cisterns are patent. No evidence of territorial infarct or acute ischemia. No extra-axial or intracranial fluid collection. Vascular: No hyperdense vessel. Skull: No fracture or focal lesion. Sinuses/Orbits: Paranasal sinuses and mastoid air cells are clear. The visualized orbits are unremarkable. Bilateral cataract resection. Other: None. CT CERVICAL SPINE FINDINGS Alignment: No traumatic subluxation. Trace degenerative anterolisthesis of C6 on C7 and C7 on T1. Skull base and vertebrae: No acute fracture. Vertebral body heights are maintained. The dens and skull base are intact. Soft tissues and spinal canal: No prevertebral fluid or swelling. No visible canal hematoma. Disc levels: Degenerative disc disease most prominent at C6-C7. No high-grade canal stenosis. Upper chest: Negative. Other: None. IMPRESSION: 1. No acute intracranial abnormality. No skull fracture. 2. No fracture or subluxation of the cervical spine. Electronically Signed   By: Keith Rake M.D.   On: 03/04/2019 13:56   Ct Cervical Spine Wo  Contrast  Result Date: 03/04/2019 CLINICAL DATA:  C-spine trauma, high clinical risk (NEXUS/CCR); Head trauma, minor, GCS>=13, high clinical risk, initial exam Dementia patient post fall today. EXAM: CT HEAD WITHOUT CONTRAST CT CERVICAL SPINE WITHOUT CONTRAST TECHNIQUE: Multidetector CT imaging of the head and cervical spine was performed following the standard protocol without intravenous contrast. Multiplanar CT image reconstructions of the cervical spine were also generated. COMPARISON:  Head and cervical spine CT 02/07/2018 FINDINGS: CT HEAD FINDINGS Brain: No intracranial hemorrhage, mass effect, or midline shift. Age related atrophy with mild chronic small vessel ischemia, unchanged. No hydrocephalus. The basilar cisterns are patent. No evidence of territorial infarct or acute ischemia. No extra-axial or intracranial fluid collection. Vascular: No hyperdense vessel. Skull: No fracture or focal lesion. Sinuses/Orbits: Paranasal sinuses and mastoid air cells are clear. The visualized orbits are unremarkable. Bilateral cataract resection. Other: None. CT CERVICAL SPINE FINDINGS Alignment: No traumatic subluxation. Trace degenerative anterolisthesis of C6 on C7 and C7 on T1. Skull base and vertebrae: No acute fracture. Vertebral body heights are maintained. The dens and skull base are intact. Soft tissues and spinal canal: No prevertebral fluid or swelling. No visible canal hematoma. Disc levels: Degenerative disc disease most prominent at C6-C7. No high-grade canal stenosis. Upper chest: Negative. Other: None. IMPRESSION: 1. No acute intracranial abnormality. No skull fracture. 2. No fracture or subluxation of the cervical spine. Electronically Signed   By: Keith Rake M.D.   On: 03/04/2019 13:56   Dg Chest Portable 1 View  Result Date: 03/04/2019 CLINICAL DATA:  83 year old female with right-sided rib pain. EXAM: PORTABLE CHEST 1 VIEW COMPARISON:  Chest radiograph dated 11/26/2017. FINDINGS: The  patient is rotated. Minimal right lung base atelectasis. No focal consolidation, pleural effusion, or pneumothorax. Moderate size hiatal hernia. Atherosclerotic calcification of the aortic arch. No acute osseous pathology. IMPRESSION: 1. No active cardiopulmonary  disease. 2. Moderate size hiatal hernia. Electronically Signed   By: Anner Crete M.D.   On: 03/04/2019 12:50    Procedures Procedures (including critical care time)  Medications Ordered in ED Medications - No data to display   Initial Impression / Assessment and Plan / ED Course  I have reviewed the triage vital signs and the nursing notes.  Pertinent labs & imaging results that were available during my care of the patient were reviewed by me and considered in my medical decision making (see chart for details).     Jessica [name] is an 83 year old female with history of dementia, thyroid disease who presents the ED after a fall 2 days ago.  Possibly right-sided chest pain.  Dementia has progressively gotten worse especially over the last few days.  Patient refusing to eat or drink.  Lives at home with her sister who is also elderly.  Sister is unable to take care of her.  The last 2 days it has been harder for her to change the patient/feed the patient/cath and the walk around.  Overall believe patient has outgrown resources at home.  Does not appear to have any focal neurological findings on exam.  She is overall pleasant but has severe dementia.  Unable to answer questions.  Will consult case management.  Overall believe patient needs placement.  Will evaluate for acute infection, trauma.  Has normal vitals.  No fever.  Patient with no significant findings on CT of head and neck.  Chest x-ray without signs of infection.  Patient with no significant anemia, electrolyte abnormality, kidney injury.  Troponin within normal limits.  EKG shows no signs of ischemic changes.  Case management has facilitated physical therapy evaluation.   Awaiting urinalysis.  Patient signed out to oncoming ED staff with patient pending physical therapy evaluation, urinalysis.  Patient's insurance will allow for direct admission to rehab facility if meets physical therapy needs.  However this may be delayed for a few days.  May attempt admission if meets criteria for inpatient rehab.  This chart was dictated using voice recognition software.  Despite best efforts to proofread,  errors can occur which can change the documentation meaning.    Final Clinical Impressions(s) / ED Diagnoses   Final diagnoses:  Failure to thrive in adult    ED Discharge Orders    None       Lennice Sites, DO 03/04/19 1513

## 2019-03-04 NOTE — ED Notes (Signed)
Per sister patient will not take all her medications. Per sister patient only takes 2 that she hides in apple sauce. All other medications listed in [facility] she has not taken in 6 months or more. She takes the levothyroxine and sertraline.

## 2019-03-04 NOTE — ED Provider Notes (Signed)
Care transferred to me.  Patient's family would have to pay out-of-pocket for her to get admitted to a facility which is seems like she needs.  They would rather take her home and try and use home health.  This has been set up by case management.  She does have a UTI but is not any more altered than typical or vomiting or febrile.  Labs are overall reassuring.  Will give Keflex and discussed plan and precautions with sister.      Durable Medical Equipment  (From admission, onward)         Start     Ordered   03/04/19 1734  For home use only DME 3 n 1  Once     03/04/19 1738   03/04/19 1733  For home use only DME wheelchair cushion (seat and back)  Once    Comments: Patient suffers from dementia, falls which impairs their ability to perform daily activities like walking, standing, bathing in the home.  A rolling walker will not resolve  issue with performing activities of daily living. A wheelchair will allow patient to safely perform daily activities. Patient's family is  able to propel patient in the home, patient unable to propel due to inability to follow commands and weakness.  Length of need lifetime. Accessories: elevating leg rests (ELRs), wheel locks, extensions and anti-tippers, back cushion   03/04/19 1738   03/04/19 1648  For home use only DME Hospital bed  Once    Question Answer Comment  Length of Need Lifetime   Patient has (list medical condition): dementia, falls, hx GERD   The above medical condition requires: Patient requires the ability to reposition frequently   Head must be elevated greater than: Other see comments   Bed type Semi-electric   Support Surface: Gel Overlay      03/04/19 1651           Sherwood Gambler, MD 03/04/19 1805

## 2019-03-04 NOTE — ED Triage Notes (Addendum)
Patient arrived via [facility] from home. Patient lives at home with family that are older.   Patient was grabbing a family member when they were falling and fell with them.   Today patient was grabbing at her right rib cage area and ems reports some tenderness.   EMS unable to complete a EKG due to patients severe dementia.   Per family patient is at her baseline.  Per family patient can walk.   Patient in [place]. EMS unable to   Hx. Sever dementia and anxiety  Takes levothyroxine and sertraline   Here in ED: p-85 100% RA   Patient continues to remove everything off of her in triage.   Per ems sister is caregiver.   Per ems patients home is very clean. Patient is being care for by her sister who was unable to clean and change patients soiled brief. EMS had to clean patient up. Sister was not physically capable.

## 2019-03-05 DIAGNOSIS — R279 Unspecified lack of coordination: Secondary | ICD-10-CM | POA: Diagnosis not present

## 2019-03-05 DIAGNOSIS — Z743 Need for continuous supervision: Secondary | ICD-10-CM | POA: Diagnosis not present

## 2019-03-05 DIAGNOSIS — R5381 Other malaise: Secondary | ICD-10-CM | POA: Diagnosis not present

## 2019-03-05 LAB — T4: T4, Total: 8 ug/dL (ref 4.5–12.0)

## 2019-03-05 NOTE — ED Notes (Signed)
PTAR at bedside 

## 2019-03-06 LAB — URINE CULTURE: Culture: >=100000 — AB

## 2019-03-07 ENCOUNTER — Telehealth: Payer: Self-pay | Admitting: Emergency Medicine

## 2019-03-07 DIAGNOSIS — N390 Urinary tract infection, site not specified: Secondary | ICD-10-CM | POA: Diagnosis not present

## 2019-03-07 DIAGNOSIS — F0280 Dementia in other diseases classified elsewhere without behavioral disturbance: Secondary | ICD-10-CM | POA: Diagnosis not present

## 2019-03-07 DIAGNOSIS — M50323 Other cervical disc degeneration at C6-C7 level: Secondary | ICD-10-CM | POA: Diagnosis not present

## 2019-03-07 DIAGNOSIS — I6782 Cerebral ischemia: Secondary | ICD-10-CM | POA: Diagnosis not present

## 2019-03-07 DIAGNOSIS — M4312 Spondylolisthesis, cervical region: Secondary | ICD-10-CM | POA: Diagnosis not present

## 2019-03-07 DIAGNOSIS — F419 Anxiety disorder, unspecified: Secondary | ICD-10-CM | POA: Diagnosis not present

## 2019-03-07 DIAGNOSIS — G319 Degenerative disease of nervous system, unspecified: Secondary | ICD-10-CM | POA: Diagnosis not present

## 2019-03-07 DIAGNOSIS — F329 Major depressive disorder, single episode, unspecified: Secondary | ICD-10-CM | POA: Diagnosis not present

## 2019-03-07 DIAGNOSIS — N183 Chronic kidney disease, stage 3 unspecified: Secondary | ICD-10-CM | POA: Diagnosis not present

## 2019-03-07 NOTE — Telephone Encounter (Signed)
Post ED Visit - Positive Culture Follow-up  Culture report reviewed by antimicrobial stewardship pharmacist: [facility] Team []  Elenor Quinones, Pharm.D. []  Heide Guile, Pharm.D., BCPS AQ-ID []  Parks Neptune, Pharm.D., BCPS []  Alycia Rossetti, Pharm.D., BCPS []  [place], [place].D., BCPS, AAHIVP []  Legrand Como, Pharm.D., BCPS, AAHIVP []  Salome Arnt, PharmD, BCPS []  Johnnette Gourd, PharmD, BCPS []  Hughes Better, PharmD, BCPS []  Leeroy Cha, PharmD []  Laqueta Linden, PharmD, BCPS []  Albertina Parr, PharmD  [place] Team []  Leodis Sias, PharmD []  Lindell Spar, PharmD []  Royetta Asal, PharmD []  Graylin Shiver, Rph []  Rema Fendt) Glennon Mac, PharmD []  Arlyn Dunning, PharmD []  Netta Cedars, PharmD []  Dia Sitter, PharmD []  Leone Haven, PharmD []  Gretta Arab, PharmD []  Theodis Shove, PharmD []  Peggyann Juba, PharmD [x]  Reuel Boom, PharmD   Positive urine culture Treated with Cephalexin, organism sensitive to the same and no further patient follow-up is required at this time.  Jessica [name] 03/07/2019, 2:05 PM

## 2019-03-09 DIAGNOSIS — F0280 Dementia in other diseases classified elsewhere without behavioral disturbance: Secondary | ICD-10-CM | POA: Diagnosis not present

## 2019-03-09 DIAGNOSIS — F0391 Unspecified dementia with behavioral disturbance: Secondary | ICD-10-CM | POA: Diagnosis not present

## 2019-03-09 DIAGNOSIS — M4312 Spondylolisthesis, cervical region: Secondary | ICD-10-CM | POA: Diagnosis not present

## 2019-03-09 DIAGNOSIS — F329 Major depressive disorder, single episode, unspecified: Secondary | ICD-10-CM | POA: Diagnosis not present

## 2019-03-09 DIAGNOSIS — G319 Degenerative disease of nervous system, unspecified: Secondary | ICD-10-CM | POA: Diagnosis not present

## 2019-03-09 DIAGNOSIS — R627 Adult failure to thrive: Secondary | ICD-10-CM | POA: Diagnosis not present

## 2019-03-09 DIAGNOSIS — M50323 Other cervical disc degeneration at C6-C7 level: Secondary | ICD-10-CM | POA: Diagnosis not present

## 2019-03-09 DIAGNOSIS — N183 Chronic kidney disease, stage 3 unspecified: Secondary | ICD-10-CM | POA: Diagnosis not present

## 2019-03-09 DIAGNOSIS — R296 Repeated falls: Secondary | ICD-10-CM | POA: Diagnosis not present

## 2019-03-09 DIAGNOSIS — N390 Urinary tract infection, site not specified: Secondary | ICD-10-CM | POA: Diagnosis not present

## 2019-03-09 DIAGNOSIS — I6782 Cerebral ischemia: Secondary | ICD-10-CM | POA: Diagnosis not present

## 2019-03-09 DIAGNOSIS — F419 Anxiety disorder, unspecified: Secondary | ICD-10-CM | POA: Diagnosis not present

## 2019-03-11 DIAGNOSIS — F0280 Dementia in other diseases classified elsewhere without behavioral disturbance: Secondary | ICD-10-CM | POA: Diagnosis not present

## 2019-03-11 DIAGNOSIS — M50323 Other cervical disc degeneration at C6-C7 level: Secondary | ICD-10-CM | POA: Diagnosis not present

## 2019-03-11 DIAGNOSIS — F329 Major depressive disorder, single episode, unspecified: Secondary | ICD-10-CM | POA: Diagnosis not present

## 2019-03-11 DIAGNOSIS — M4312 Spondylolisthesis, cervical region: Secondary | ICD-10-CM | POA: Diagnosis not present

## 2019-03-11 DIAGNOSIS — G319 Degenerative disease of nervous system, unspecified: Secondary | ICD-10-CM | POA: Diagnosis not present

## 2019-03-11 DIAGNOSIS — N183 Chronic kidney disease, stage 3 unspecified: Secondary | ICD-10-CM | POA: Diagnosis not present

## 2019-03-11 DIAGNOSIS — I6782 Cerebral ischemia: Secondary | ICD-10-CM | POA: Diagnosis not present

## 2019-03-11 DIAGNOSIS — F419 Anxiety disorder, unspecified: Secondary | ICD-10-CM | POA: Diagnosis not present

## 2019-03-11 DIAGNOSIS — N390 Urinary tract infection, site not specified: Secondary | ICD-10-CM | POA: Diagnosis not present

## 2020-05-10 DEATH — deceased
